# Patient Record
Sex: Female | Born: 1995 | Race: Asian | Hispanic: No | Marital: Married
Health system: Southern US, Community
[De-identification: ages and names within clinical notes are randomized; demographics above are authoritative.]

## PROBLEM LIST (undated history)

## (undated) DIAGNOSIS — R7303 Prediabetes: Secondary | ICD-10-CM

## (undated) DIAGNOSIS — E039 Hypothyroidism, unspecified: Secondary | ICD-10-CM

## (undated) DIAGNOSIS — D352 Benign neoplasm of pituitary gland: Secondary | ICD-10-CM

## (undated) DIAGNOSIS — Z905 Acquired absence of kidney: Secondary | ICD-10-CM

## (undated) HISTORY — DX: Prediabetes: R73.03

## (undated) HISTORY — DX: Acquired absence of kidney: Z90.5

## (undated) HISTORY — DX: Benign neoplasm of pituitary gland: D35.2

## (undated) HISTORY — PX: NEPHRECTOMY: SHX65

## (undated) HISTORY — DX: Hypothyroidism, unspecified: E03.9

---

## 2020-07-19 ENCOUNTER — Other Ambulatory Visit: Payer: Self-pay | Admitting: Internal Medicine

## 2020-07-19 DIAGNOSIS — D443 Neoplasm of uncertain behavior of pituitary gland: Secondary | ICD-10-CM

## 2020-07-19 DIAGNOSIS — D352 Benign neoplasm of pituitary gland: Secondary | ICD-10-CM

## 2020-07-23 ENCOUNTER — Ambulatory Visit
Admission: RE | Admit: 2020-07-23 | Discharge: 2020-07-23 | Disposition: A | Discharge status: Home / Self Care | Payer: No Typology Code available for payment source | Source: Ambulatory Visit | Attending: Internal Medicine | Admitting: Internal Medicine

## 2020-07-23 ENCOUNTER — Other Ambulatory Visit: Payer: Self-pay

## 2020-07-23 DIAGNOSIS — D352 Benign neoplasm of pituitary gland: Secondary | ICD-10-CM

## 2020-07-23 DIAGNOSIS — D443 Neoplasm of uncertain behavior of pituitary gland: Secondary | ICD-10-CM

## 2020-08-06 ENCOUNTER — Other Ambulatory Visit: Payer: Self-pay

## 2020-08-06 ENCOUNTER — Ambulatory Visit
Admission: RE | Admit: 2020-08-06 | Discharge: 2020-08-06 | Disposition: A | Discharge status: Home / Self Care | Payer: No Typology Code available for payment source | Source: Ambulatory Visit | Attending: Internal Medicine | Admitting: Internal Medicine

## 2020-08-06 DIAGNOSIS — D443 Neoplasm of uncertain behavior of pituitary gland: Secondary | ICD-10-CM | POA: Diagnosis not present

## 2020-08-06 MED ORDER — GADOBUTROL 1 MMOL/ML IV SOLN
6.0000 mL | Freq: Once | INTRAVENOUS | Status: AC | PRN
  Administered 2020-08-06: 6 mL via INTRAVENOUS

## 2020-08-11 ENCOUNTER — Encounter: Payer: No Typology Code available for payment source | Admitting: Obstetrics and Gynecology

## 2020-08-15 ENCOUNTER — Encounter: Payer: Self-pay | Admitting: Obstetrics and Gynecology

## 2020-08-15 ENCOUNTER — Telehealth: Payer: Self-pay | Admitting: Obstetrics and Gynecology

## 2020-08-15 NOTE — Telephone Encounter (Signed)
Patient No showed a referral appointment, letter sent.

## 2020-10-23 ENCOUNTER — Other Ambulatory Visit: Payer: Self-pay

## 2020-10-23 MED ORDER — CABERGOLINE 0.5 MG PO TABS
ORAL_TABLET | ORAL | 2 refills | Status: DC
  Filled 2020-10-23: qty 13, 90d supply, fill #0
  Filled 2021-01-29: qty 13, 90d supply, fill #1

## 2021-01-02 ENCOUNTER — Other Ambulatory Visit: Payer: Self-pay

## 2021-01-02 DIAGNOSIS — R7303 Prediabetes: Secondary | ICD-10-CM | POA: Insufficient documentation

## 2021-01-02 MED ORDER — CABERGOLINE 0.5 MG PO TABS
ORAL_TABLET | ORAL | 2 refills | Status: DC
  Filled 2021-01-02: qty 13, 42d supply, fill #0
  Filled 2021-05-14: qty 13, 90d supply, fill #0

## 2021-01-15 ENCOUNTER — Other Ambulatory Visit: Payer: Self-pay

## 2021-01-22 ENCOUNTER — Encounter: Payer: Self-pay | Admitting: Obstetrics & Gynecology

## 2021-01-22 ENCOUNTER — Ambulatory Visit (INDEPENDENT_AMBULATORY_CARE_PROVIDER_SITE_OTHER): Payer: No Typology Code available for payment source | Admitting: Obstetrics & Gynecology

## 2021-01-22 ENCOUNTER — Other Ambulatory Visit: Payer: Self-pay

## 2021-01-22 VITALS — BP 90/60 | Ht 63.0 in | Wt 118.0 lb

## 2021-01-22 DIAGNOSIS — D352 Benign neoplasm of pituitary gland: Secondary | ICD-10-CM | POA: Diagnosis not present

## 2021-01-22 NOTE — Progress Notes (Signed)
  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  Now 28-30 day cycles the last 3 mos.  She has mid ovulatory pain 2 of the last 3 mos.  No prior pregnancy or gyn concerns.  PMHx: She  has no past medical history on file. Also,  has no past surgical history on file., family history is not on file.,  reports that she has never smoked. She has never used smokeless tobacco. She reports that she does not drink alcohol and does not use drugs.  She has a current medication list which includes the following prescription(s): cabergoline. Also, has No Known Allergies.  Review of Systems  Constitutional:  Negative for chills, fever and malaise/fatigue.  HENT:  Negative for congestion, sinus pain and sore throat.   Eyes:  Negative for blurred vision and pain.  Respiratory:  Negative for cough and wheezing.   Cardiovascular:  Negative for chest pain and leg swelling.  Gastrointestinal:  Negative for abdominal pain, constipation, diarrhea, heartburn, nausea and vomiting.  Genitourinary:  Negative for dysuria, frequency, hematuria and urgency.  Musculoskeletal:  Negative for back pain, joint pain, myalgias and neck pain.  Skin:  Negative for itching and rash.  Neurological:  Negative for dizziness, tremors and weakness.  Endo/Heme/Allergies:  Does not bruise/bleed easily.  Psychiatric/Behavioral:  Negative for depression. The patient is not nervous/anxious and does not have insomnia.    Objective: BP 90/60   Ht '5\' 3"'$  (1.6 m)   Wt 118 lb (53.5 kg)   LMP 01/05/2021   BMI 20.90 kg/m  Physical Exam Constitutional:      General: She is not in acute distress.    Appearance: She is well-developed.  Musculoskeletal:        General: Normal range of motion.  Neurological:     Mental Status: She is alert and oriented to person, place, and time.  Skin:     General: Skin is warm and dry.  Vitals reviewed.   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 PNV advised, also healthy practices prior to and during early pregnancy Plan PAP as she has never had one, prefers female provider Consider Clomid if in need of ovulatory assistence, but give time now that she is normalizing on meds  Barnett Applebaum, MD, [place], [facility] 01/22/2021  9:10 AM

## 2021-01-29 ENCOUNTER — Other Ambulatory Visit: Payer: Self-pay

## 2021-01-30 ENCOUNTER — Other Ambulatory Visit: Payer: Self-pay

## 2021-05-02 ENCOUNTER — Inpatient Hospital Stay: Admit: 2021-05-02 | Payer: Self-pay

## 2021-05-02 ENCOUNTER — Other Ambulatory Visit
Admission: RE | Admit: 2021-05-02 | Discharge: 2021-05-02 | Disposition: A | Discharge status: Home / Self Care | Payer: No Typology Code available for payment source | Attending: "Endocrinology | Admitting: "Endocrinology

## 2021-05-02 DIAGNOSIS — E221 Hyperprolactinemia: Secondary | ICD-10-CM | POA: Diagnosis not present

## 2021-05-02 DIAGNOSIS — D497 Neoplasm of unspecified behavior of endocrine glands and other parts of nervous system: Secondary | ICD-10-CM | POA: Insufficient documentation

## 2021-05-02 LAB — CORTISOL: Cortisol, Plasma: 2.6 ug/dL

## 2021-05-04 LAB — PROLACTIN: Prolactin: 10.7 ng/mL (ref 4.8–23.3)

## 2021-05-04 LAB — ACTH: C206 ACTH: 6.3 pg/mL — ABNORMAL LOW (ref 7.2–63.3)

## 2021-05-15 ENCOUNTER — Other Ambulatory Visit: Payer: Self-pay

## 2021-08-15 ENCOUNTER — Other Ambulatory Visit: Payer: Self-pay

## 2021-08-15 MED ORDER — CABERGOLINE 0.5 MG PO TABS
ORAL_TABLET | ORAL | 2 refills | Status: DC
  Filled 2021-08-15: qty 13, 90d supply, fill #0
  Filled 2021-08-27: qty 4, 28d supply, fill #0

## 2021-08-23 ENCOUNTER — Other Ambulatory Visit: Payer: Self-pay

## 2021-08-27 ENCOUNTER — Other Ambulatory Visit: Payer: Self-pay

## 2021-09-13 ENCOUNTER — Other Ambulatory Visit: Payer: Self-pay

## 2021-09-13 MED ORDER — CABERGOLINE 0.5 MG PO TABS
ORAL_TABLET | ORAL | 2 refills | Status: DC
  Filled 2021-09-13 – 2021-10-03 (×2): qty 4, 28d supply, fill #0

## 2021-09-17 ENCOUNTER — Other Ambulatory Visit: Payer: Self-pay

## 2021-10-02 ENCOUNTER — Other Ambulatory Visit: Payer: Self-pay

## 2021-10-03 ENCOUNTER — Other Ambulatory Visit: Payer: Self-pay

## 2021-10-26 ENCOUNTER — Other Ambulatory Visit: Payer: Self-pay

## 2021-10-26 MED ORDER — CABERGOLINE 0.5 MG PO TABS
ORAL_TABLET | ORAL | 2 refills | Status: DC
  Filled 2021-10-26: qty 13, 30d supply, fill #0
  Filled 2021-11-07: qty 4, 28d supply, fill #0

## 2021-10-26 MED ORDER — IBUPROFEN 800 MG PO TABS
ORAL_TABLET | ORAL | 0 refills | Status: DC
  Filled 2021-10-26: qty 20, 7d supply, fill #0

## 2021-11-06 ENCOUNTER — Other Ambulatory Visit: Payer: Self-pay

## 2021-11-07 ENCOUNTER — Other Ambulatory Visit: Payer: Self-pay

## 2021-12-04 ENCOUNTER — Other Ambulatory Visit: Payer: Self-pay

## 2021-12-04 MED ORDER — CABERGOLINE 0.5 MG PO TABS
ORAL_TABLET | ORAL | 2 refills | Status: DC
  Filled 2021-12-04: qty 4, 28d supply, fill #0

## 2022-01-07 ENCOUNTER — Other Ambulatory Visit: Payer: Self-pay

## 2022-01-07 MED ORDER — CABERGOLINE 0.5 MG PO TABS
ORAL_TABLET | ORAL | 2 refills | Status: DC
  Filled 2022-01-07: qty 5, 30d supply, fill #0

## 2022-01-19 IMAGING — MR MR HEAD WO/W CM
12 of 19 series · 26 of 48 positions shown · IV contrast (6ml Gadavist)
Comparison: None.

CLINICAL DATA: Elevated prolactin of 126. Irregular menstrual
cycle.

EXAM:
MRI HEAD WITHOUT AND WITH CONTRAST
TECHNIQUE: Multiplanar, multiecho pulse sequences of the brain and surrounding
structures were obtained without and with intravenous contrast.
CONTRAST:  6mL GADAVIST GADOBUTROL 1 MMOL/ML IV SOLN

[Series 5: T1 · sagittal · 5.0mm · 0.62mm/px · 2 of 21 slices shown]
[im 1/21]
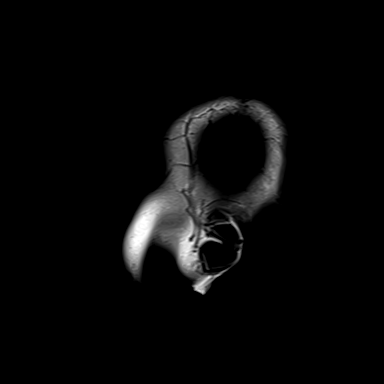
[im 21/21]
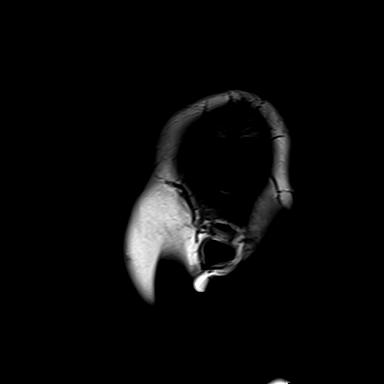

[Series 8: T2 · axial · 5.0mm · 0.53mm/px · z∈[-107,+37]mm · 2 of 25 slices shown]
[im 1/25]
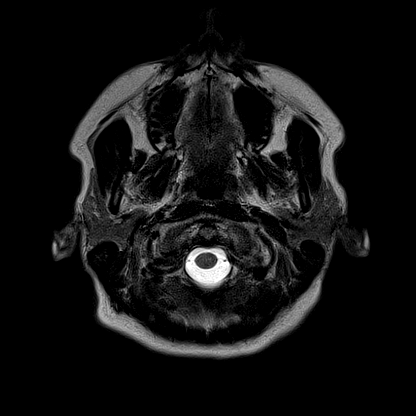
[im 25/25]
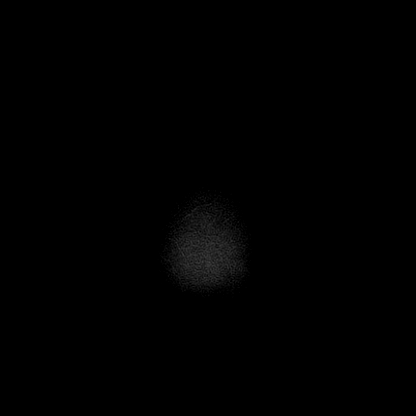

[Series 10: FLAIR · axial · 3.0mm · 0.53mm/px · z∈[-110,+40]mm · 4 of 51 slices shown]
[im 1/51]
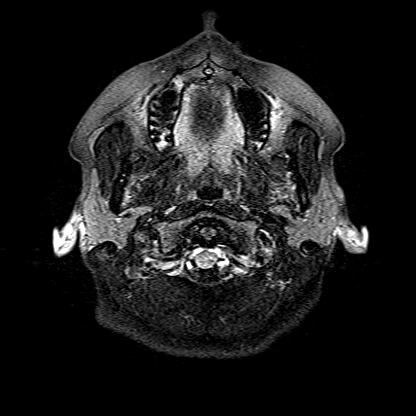
[im 17/51]
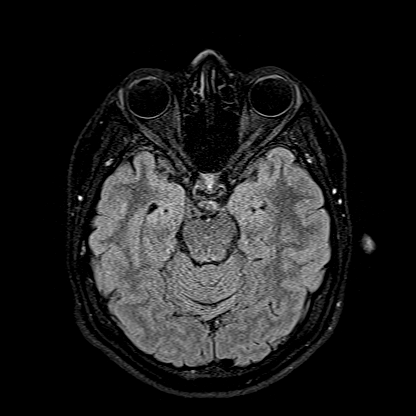
[im 34/51]
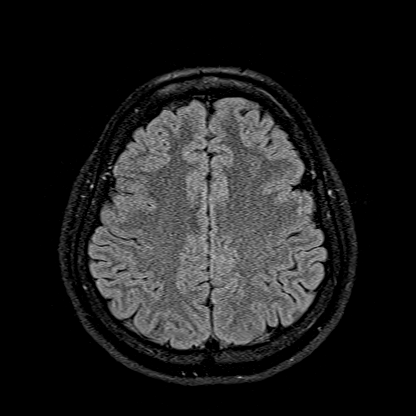
[im 51/51]
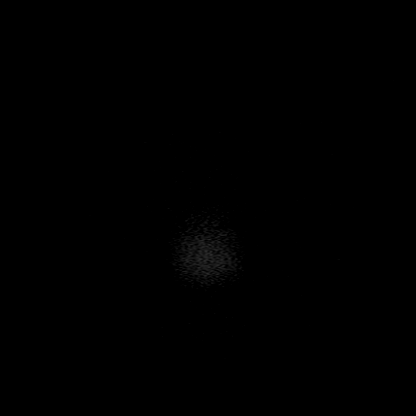

[Series 14: T1 post-contrast · coronal · 3.0mm · 0.28mm/px · 1 of 11 slices shown (1 of 9)]
[im 1/11]
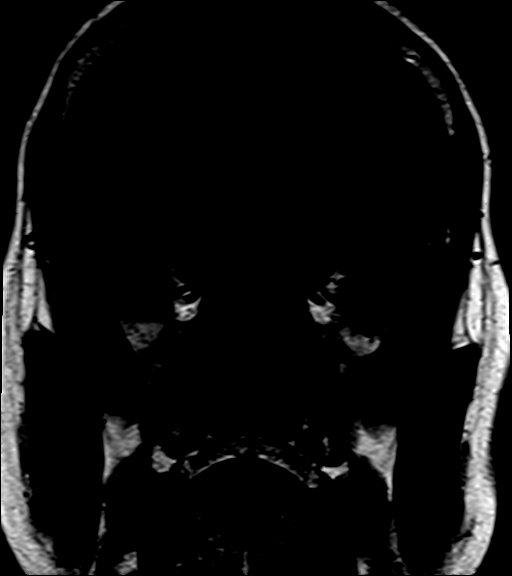

[Series 15: T1 post-contrast · coronal · 3.0mm · 0.28mm/px · 1 of 11 slices shown (2 of 9)]
[im 1/11]
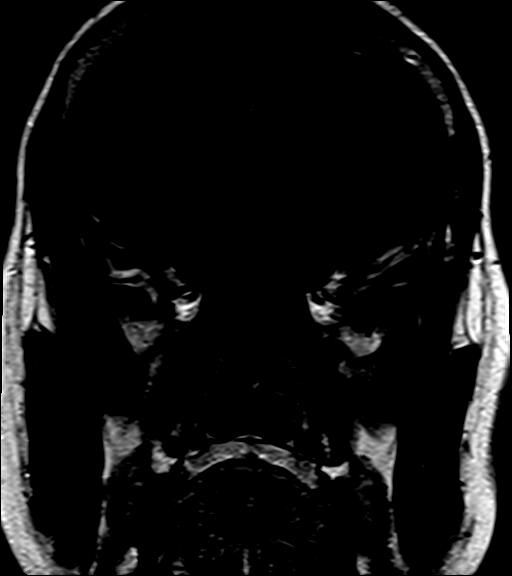

[Series 16: T1 post-contrast · coronal · 3.0mm · 0.28mm/px · 1 of 11 slices shown (3 of 9)]
[im 1/11]
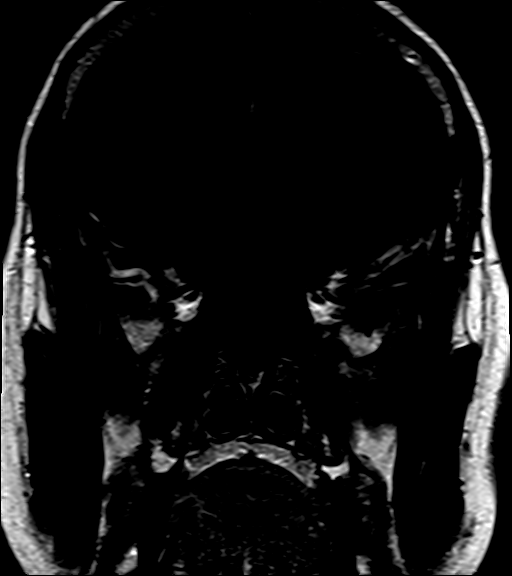

[Series 17: T1 post-contrast · coronal · 3.0mm · 0.28mm/px · 1 of 11 slices shown (4 of 9)]
[im 1/11]
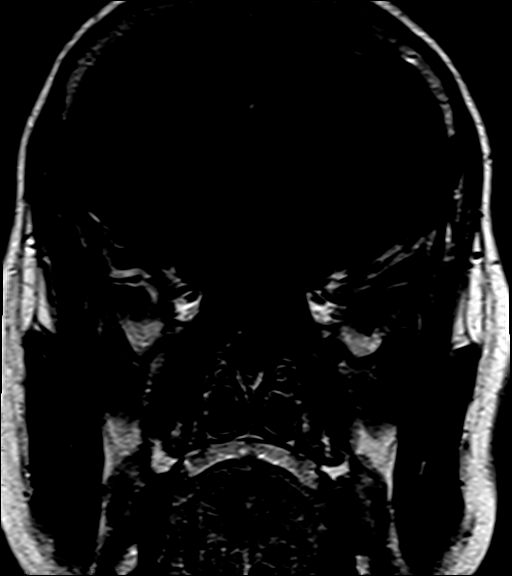

[Series 18: T1 post-contrast · coronal · 3.0mm · 0.28mm/px · 1 of 11 slices shown (5 of 9)]
[im 1/11]
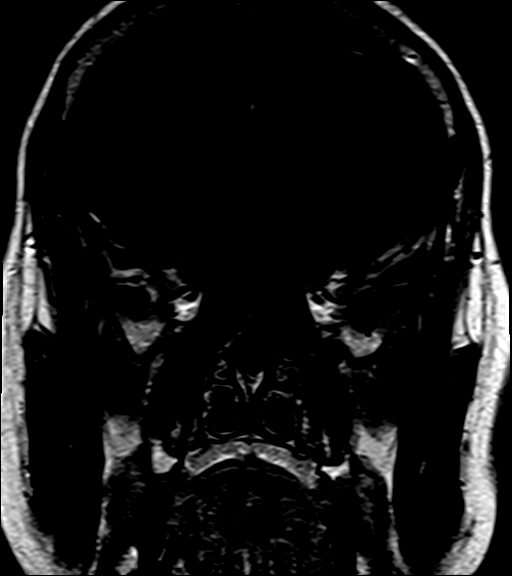

[Series 19: T1 post-contrast · coronal · 3.0mm · 0.21mm/px · 1 of 13 slices shown (6 of 9)]
[im 1/13]
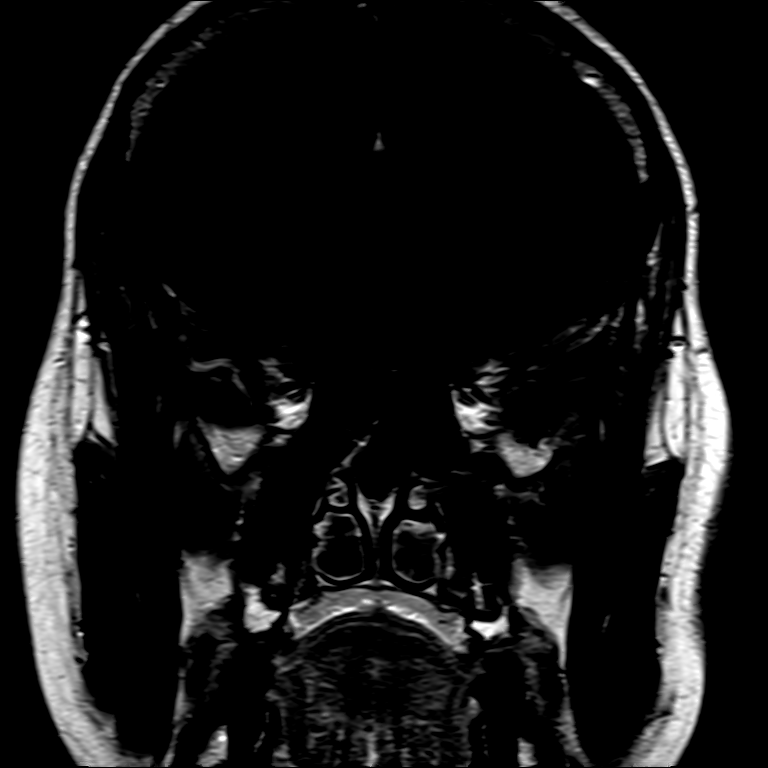

[Series 20: T1 post-contrast · sagittal · 3.0mm · 0.21mm/px · 1 of 13 slices shown (7 of 9)]
[im 1/13]
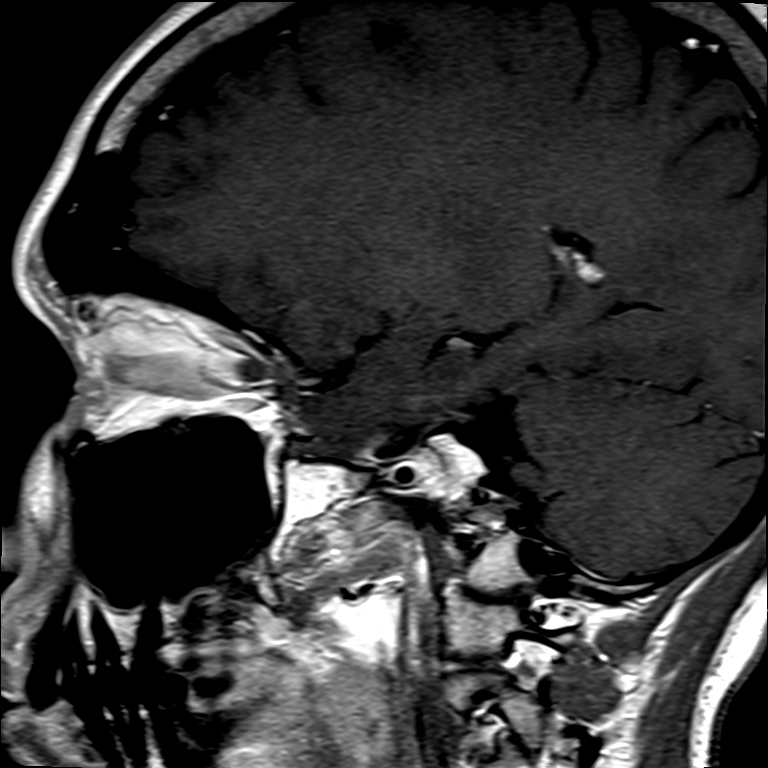

[Series 21: T1 post-contrast · axial · 1.0mm · 0.98mm/px · z∈[-111,+53]mm · 8 of 176 slices shown (8 of 9)]
[im 12/176]
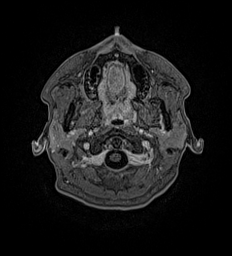
[im 36/176]
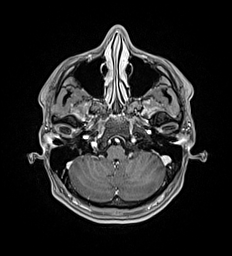
[im 59/176]
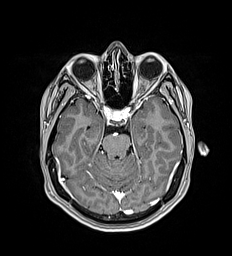
[im 82/176]
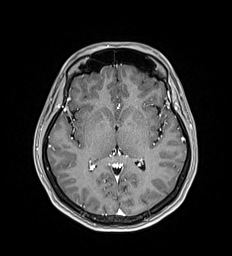
[im 106/176]
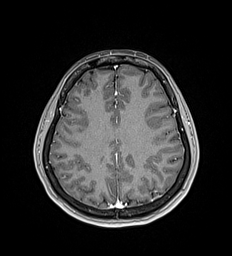
[im 129/176]
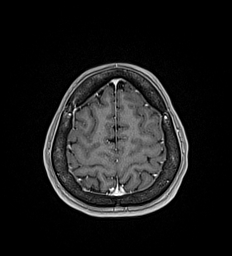
[im 152/176]
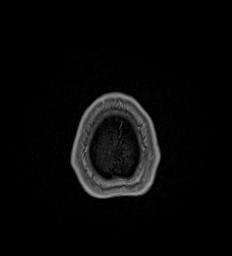
[im 176/176]
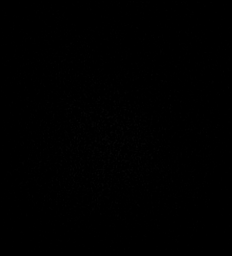

[Series 22: T1 post-contrast · coronal · 5.0mm · 0.57mm/px · 3 of 29 slices shown (9 of 9)]
[im 1/29]
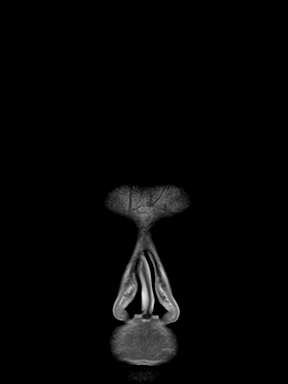
[im 15/29]
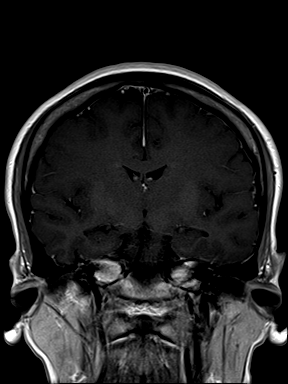
[im 29/29]
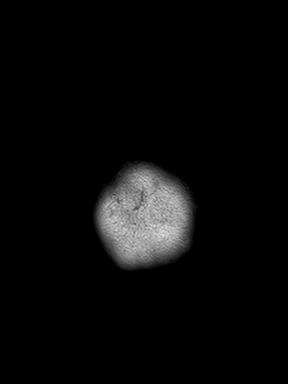

[26 of 48 positions shown; findings below may reference images not displayed]

FINDINGS: Brain: No acute infarction, hemorrhage, hydrocephalus, extra-axial
collection or mass lesion outside the pituitary. No abnormal
enhancement outside the pituitary.

Dedicated pituitary protocol was performed with high-resolution
dynamic contrast through the pituitary. This demonstrates an
approximately 7 mm area of hypoenhancement within the right aspect
of the pituitary (for example series 16, image 6) which decreases in
conspicuity on delayed phases. There is slight elevation of the
right aspect of the pituitary gland with slight leftward
infundibular deviation. The infundibulum is otherwise unremarkable
in size/appearance.

Vascular: Major arterial flow voids are maintained at the skull
base.

Skull and upper cervical spine: Normal marrow signal.

Sinuses/Orbits: Clear sinuses.  Unremarkable orbits.

Other: No mastoid effusions.
IMPRESSION: 1. Approximately 7 mm rounded focus of hypoenhancement within the
right aspect of the pituitary gland, concerning for a microadenoma
given the provided clinical history.
2. Otherwise, no acute intracranial abnormality.

## 2022-05-02 ENCOUNTER — Other Ambulatory Visit
Admission: RE | Admit: 2022-05-02 | Discharge: 2022-05-02 | Disposition: A | Discharge status: Home / Self Care | Payer: Self-pay | Attending: Internal Medicine | Admitting: Internal Medicine

## 2022-05-02 DIAGNOSIS — Z524 Kidney donor: Secondary | ICD-10-CM | POA: Insufficient documentation

## 2022-05-02 LAB — CBC WITH DIFFERENTIAL/PLATELET
Abs Immature Granulocytes: 0.02 10*3/uL (ref 0.00–0.07)
Basophils Absolute: 0.1 10*3/uL (ref 0.0–0.1)
Basophils Relative: 1 %
Eosinophils Absolute: 0.1 10*3/uL (ref 0.0–0.5)
Eosinophils Relative: 1 %
HCT: 41.9 % (ref 36.0–46.0)
Hemoglobin: 14.3 g/dL (ref 12.0–15.0)
Immature Granulocytes: 0 %
Lymphocytes Relative: 30 %
Lymphs Abs: 2.3 10*3/uL (ref 0.7–4.0)
MCH: 27.1 pg (ref 26.0–34.0)
MCHC: 34.1 g/dL (ref 30.0–36.0)
MCV: 79.4 fL — ABNORMAL LOW (ref 80.0–100.0)
Monocytes Absolute: 0.6 10*3/uL (ref 0.1–1.0)
Monocytes Relative: 8 %
Neutro Abs: 4.6 10*3/uL (ref 1.7–7.7)
Neutrophils Relative %: 60 %
Platelets: 327 10*3/uL (ref 150–400)
RBC: 5.28 MIL/uL — ABNORMAL HIGH (ref 3.87–5.11)
RDW: 12.5 % (ref 11.5–15.5)
WBC: 7.8 10*3/uL (ref 4.0–10.5)
nRBC: 0 % (ref 0.0–0.2)

## 2022-05-02 LAB — URINALYSIS, COMPLETE (UACMP) WITH MICROSCOPIC
Bacteria, UA: NONE SEEN
Bilirubin Urine: NEGATIVE
Glucose, UA: NEGATIVE mg/dL
Hgb urine dipstick: NEGATIVE
Ketones, ur: NEGATIVE mg/dL
Leukocytes,Ua: NEGATIVE
Nitrite: NEGATIVE
Protein, ur: NEGATIVE mg/dL
Specific Gravity, Urine: 1.002 — ABNORMAL LOW (ref 1.005–1.030)
pH: 7 (ref 5.0–8.0)

## 2022-05-02 LAB — COMPREHENSIVE METABOLIC PANEL
ALT: 10 U/L (ref 0–44)
AST: 17 U/L (ref 15–41)
Albumin: 4 g/dL (ref 3.5–5.0)
Alkaline Phosphatase: 57 U/L (ref 38–126)
Anion gap: 7 (ref 5–15)
BUN: 13 mg/dL (ref 6–20)
CO2: 23 mmol/L (ref 22–32)
Calcium: 8.8 mg/dL — ABNORMAL LOW (ref 8.9–10.3)
Chloride: 104 mmol/L (ref 98–111)
Creatinine, Ser: 0.56 mg/dL (ref 0.44–1.00)
GFR, Estimated: >60 mL/min (ref >60)
Glucose, Bld: 96 mg/dL (ref 70–99)
Potassium: 4 mmol/L (ref 3.5–5.1)
Sodium: 134 mmol/L — ABNORMAL LOW (ref 135–145)
Total Bilirubin: 0.9 mg/dL (ref 0.3–1.2)
Total Protein: 7.2 g/dL (ref 6.5–8.1)

## 2022-05-02 LAB — HEMOGLOBIN A1C
Hgb A1c MFr Bld: 5.5 % (ref 4.8–5.6)
Mean Plasma Glucose: 111.15 mg/dL

## 2022-05-02 LAB — ABO/RH: ABO/RH(D): O POS

## 2022-05-03 LAB — MISC LABCORP TEST (SEND OUT)
Labcorp test code: 121022
Labcorp test code: 6002

## 2022-08-17 DIAGNOSIS — Z905 Acquired absence of kidney: Secondary | ICD-10-CM | POA: Insufficient documentation

## 2022-08-22 ENCOUNTER — Other Ambulatory Visit: Payer: Self-pay

## 2022-08-22 MED ORDER — AMOXICILLIN-POT CLAVULANATE 875-125 MG PO TABS
1.0000 | ORAL_TABLET | Freq: Two times a day (BID) | ORAL | 0 refills | Status: DC
  Filled 2022-08-22: qty 14, 7d supply, fill #0

## 2023-01-14 DIAGNOSIS — D497 Neoplasm of unspecified behavior of endocrine glands and other parts of nervous system: Secondary | ICD-10-CM | POA: Diagnosis not present

## 2023-01-14 DIAGNOSIS — Z524 Kidney donor: Secondary | ICD-10-CM | POA: Diagnosis not present

## 2023-01-14 DIAGNOSIS — E221 Hyperprolactinemia: Secondary | ICD-10-CM | POA: Diagnosis not present

## 2023-01-15 ENCOUNTER — Other Ambulatory Visit: Payer: Self-pay

## 2023-01-15 MED ORDER — CABERGOLINE 0.5 MG PO TABS
0.5000 mg | ORAL_TABLET | ORAL | 3 refills | Status: DC
  Filled 2023-01-15: qty 8, 28d supply, fill #0
  Filled 2023-04-14: qty 8, 28d supply, fill #1

## 2023-01-16 ENCOUNTER — Other Ambulatory Visit: Payer: Self-pay

## 2023-01-20 ENCOUNTER — Other Ambulatory Visit: Payer: Self-pay | Admitting: "Endocrinology

## 2023-01-20 DIAGNOSIS — D497 Neoplasm of unspecified behavior of endocrine glands and other parts of nervous system: Secondary | ICD-10-CM

## 2023-01-29 ENCOUNTER — Ambulatory Visit
Admission: RE | Admit: 2023-01-29 | Discharge: 2023-01-29 | Disposition: A | Discharge status: Home / Self Care | Payer: 59 | Source: Ambulatory Visit | Attending: "Endocrinology | Admitting: "Endocrinology

## 2023-01-29 DIAGNOSIS — D352 Benign neoplasm of pituitary gland: Secondary | ICD-10-CM | POA: Diagnosis not present

## 2023-01-29 DIAGNOSIS — E236 Other disorders of pituitary gland: Secondary | ICD-10-CM | POA: Diagnosis not present

## 2023-01-29 DIAGNOSIS — D497 Neoplasm of unspecified behavior of endocrine glands and other parts of nervous system: Secondary | ICD-10-CM | POA: Diagnosis not present

## 2023-01-29 MED ORDER — GADOBUTROL 1 MMOL/ML IV SOLN
5.0000 mL | Freq: Once | INTRAVENOUS | Status: AC | PRN
  Administered 2023-01-29: 5 mL via INTRAVENOUS

## 2023-02-03 ENCOUNTER — Other Ambulatory Visit: Payer: Self-pay | Admitting: Oncology

## 2023-02-03 DIAGNOSIS — Z006 Encounter for examination for normal comparison and control in clinical research program: Secondary | ICD-10-CM

## 2023-02-11 ENCOUNTER — Other Ambulatory Visit
Admission: RE | Admit: 2023-02-11 | Discharge: 2023-02-11 | Disposition: A | Discharge status: Home / Self Care | Payer: 59 | Attending: Oncology | Admitting: Oncology

## 2023-02-11 DIAGNOSIS — Z006 Encounter for examination for normal comparison and control in clinical research program: Secondary | ICD-10-CM | POA: Insufficient documentation

## 2023-03-04 DIAGNOSIS — Z0000 Encounter for general adult medical examination without abnormal findings: Secondary | ICD-10-CM | POA: Diagnosis not present

## 2023-03-04 DIAGNOSIS — Z3169 Encounter for other general counseling and advice on procreation: Secondary | ICD-10-CM | POA: Diagnosis not present

## 2023-03-04 DIAGNOSIS — D352 Benign neoplasm of pituitary gland: Secondary | ICD-10-CM | POA: Diagnosis not present

## 2023-03-04 DIAGNOSIS — Z905 Acquired absence of kidney: Secondary | ICD-10-CM | POA: Diagnosis not present

## 2023-03-04 DIAGNOSIS — R7303 Prediabetes: Secondary | ICD-10-CM | POA: Diagnosis not present

## 2023-04-14 ENCOUNTER — Other Ambulatory Visit: Payer: Self-pay

## 2023-04-14 DIAGNOSIS — Z905 Acquired absence of kidney: Secondary | ICD-10-CM | POA: Diagnosis not present

## 2023-04-14 DIAGNOSIS — Z7185 Encounter for immunization safety counseling: Secondary | ICD-10-CM | POA: Diagnosis not present

## 2023-04-14 DIAGNOSIS — N979 Female infertility, unspecified: Secondary | ICD-10-CM | POA: Diagnosis not present

## 2023-04-14 DIAGNOSIS — D352 Benign neoplasm of pituitary gland: Secondary | ICD-10-CM | POA: Diagnosis not present

## 2023-04-14 DIAGNOSIS — Z23 Encounter for immunization: Secondary | ICD-10-CM | POA: Diagnosis not present

## 2023-04-14 DIAGNOSIS — Z3169 Encounter for other general counseling and advice on procreation: Secondary | ICD-10-CM | POA: Diagnosis not present

## 2023-04-14 MED ORDER — DOXYCYCLINE HYCLATE 100 MG PO TABS
100.0000 mg | ORAL_TABLET | Freq: Two times a day (BID) | ORAL | 0 refills | Status: DC
  Filled 2023-04-14: qty 10, 5d supply, fill #0

## 2023-04-17 DIAGNOSIS — E039 Hypothyroidism, unspecified: Secondary | ICD-10-CM | POA: Insufficient documentation

## 2023-04-17 DIAGNOSIS — R7989 Other specified abnormal findings of blood chemistry: Secondary | ICD-10-CM | POA: Diagnosis not present

## 2023-04-17 DIAGNOSIS — Z3169 Encounter for other general counseling and advice on procreation: Secondary | ICD-10-CM | POA: Diagnosis not present

## 2023-04-28 ENCOUNTER — Other Ambulatory Visit: Payer: Self-pay

## 2023-04-28 DIAGNOSIS — R946 Abnormal results of thyroid function studies: Secondary | ICD-10-CM | POA: Diagnosis not present

## 2023-04-28 DIAGNOSIS — Z3169 Encounter for other general counseling and advice on procreation: Secondary | ICD-10-CM | POA: Diagnosis not present

## 2023-04-28 DIAGNOSIS — R7989 Other specified abnormal findings of blood chemistry: Secondary | ICD-10-CM | POA: Diagnosis not present

## 2023-04-28 MED ORDER — LEVOTHYROXINE SODIUM 50 MCG PO TABS
50.0000 ug | ORAL_TABLET | Freq: Every day | ORAL | 3 refills | Status: DC
  Filled 2023-04-28: qty 90, 90d supply, fill #0

## 2023-04-29 ENCOUNTER — Other Ambulatory Visit: Payer: Self-pay

## 2023-04-29 MED ORDER — CABERGOLINE 0.5 MG PO TABS
0.2500 mg | ORAL_TABLET | ORAL | 3 refills | Status: DC
  Filled 2023-04-29: qty 16, 75d supply, fill #0

## 2023-05-21 DIAGNOSIS — Z124 Encounter for screening for malignant neoplasm of cervix: Secondary | ICD-10-CM | POA: Diagnosis not present

## 2023-05-21 DIAGNOSIS — E039 Hypothyroidism, unspecified: Secondary | ICD-10-CM | POA: Diagnosis not present

## 2023-05-21 DIAGNOSIS — Z23 Encounter for immunization: Secondary | ICD-10-CM | POA: Diagnosis not present

## 2023-05-21 DIAGNOSIS — R7303 Prediabetes: Secondary | ICD-10-CM | POA: Diagnosis not present

## 2023-05-21 DIAGNOSIS — E221 Hyperprolactinemia: Secondary | ICD-10-CM | POA: Insufficient documentation

## 2023-05-21 DIAGNOSIS — D352 Benign neoplasm of pituitary gland: Secondary | ICD-10-CM | POA: Diagnosis not present

## 2023-05-21 DIAGNOSIS — N979 Female infertility, unspecified: Secondary | ICD-10-CM | POA: Diagnosis not present

## 2023-06-16 ENCOUNTER — Other Ambulatory Visit: Payer: Self-pay

## 2023-06-16 MED ORDER — CABERGOLINE 0.5 MG PO TABS
0.2500 mg | ORAL_TABLET | ORAL | 3 refills | Status: DC
  Filled 2023-06-16: qty 16, 74d supply, fill #0
  Filled 2023-10-16: qty 16, 74d supply, fill #1

## 2023-06-19 ENCOUNTER — Other Ambulatory Visit: Payer: Self-pay

## 2023-07-01 DIAGNOSIS — Z20828 Contact with and (suspected) exposure to other viral communicable diseases: Secondary | ICD-10-CM | POA: Diagnosis not present

## 2023-07-03 ENCOUNTER — Other Ambulatory Visit: Payer: Self-pay

## 2023-07-17 DIAGNOSIS — Z0000 Encounter for general adult medical examination without abnormal findings: Secondary | ICD-10-CM | POA: Diagnosis not present

## 2023-07-17 DIAGNOSIS — D352 Benign neoplasm of pituitary gland: Secondary | ICD-10-CM | POA: Diagnosis not present

## 2023-07-23 ENCOUNTER — Other Ambulatory Visit: Payer: Self-pay

## 2023-07-23 DIAGNOSIS — E039 Hypothyroidism, unspecified: Secondary | ICD-10-CM | POA: Diagnosis not present

## 2023-07-23 DIAGNOSIS — E221 Hyperprolactinemia: Secondary | ICD-10-CM | POA: Diagnosis not present

## 2023-07-23 MED ORDER — LEVOTHYROXINE SODIUM 75 MCG PO TABS
75.0000 ug | ORAL_TABLET | Freq: Every day | ORAL | 3 refills | Status: DC
  Filled 2023-07-23: qty 90, 90d supply, fill #0
  Filled 2023-10-16: qty 90, 90d supply, fill #1

## 2023-07-24 ENCOUNTER — Ambulatory Visit: Payer: 59 | Admitting: Dietician

## 2024-01-28 DIAGNOSIS — E039 Hypothyroidism, unspecified: Secondary | ICD-10-CM | POA: Diagnosis not present

## 2024-01-28 DIAGNOSIS — E221 Hyperprolactinemia: Secondary | ICD-10-CM | POA: Diagnosis not present

## 2024-01-28 NOTE — Progress Notes (Addendum)
 History of present illness Nicole Taylor is seen today for f/u of pituitary.  She is a 28 y.o. female who began having irregular menstrual cycles in 12/21.  She was also having galactorrhea, so a prolactin was checked in 1/22 that was significantly elevated.  An MRI of the pituitary was ordered which showed a pituitary tumor.  In 4/22, I screened her for other pituitary hormone abnormalities and started her on cabergoline .  I last saw her in 1/25.  At that time, I increased her LT4.  She took USMLE step 1 in 3/25. She is studying for step 2.   She is currently on cabergoline  0.25 tiw.  She has no headaches.   She has no changes in vision.  She has no galactorrhea.  Her cycles are normal.  She is concerned about her pituitary.   She is currently on 75 mcg LT4 daily.  She has no anterior neck pain/swelling.  She says she has a family hx of thyroid  disease.  She had some weight gain.  She feels fine overall.  She is concerned about her thyroid .    ROS:  No chest pain.  No SOB.   Medical History: Past Medical History:  Diagnosis Date   Hyperprolactinemia (CMS/HHS-HCC)    Hypothyroidism    Prediabetes    Prolactinoma (CMS/HHS-HCC)    Solitary kidney, acquired     Surgical History: Past Surgical History:  Procedure Laterality Date   LAPAROSCOPIC NEPHRECTOMY LIVING DONOR Left 08/16/2022   Procedure: LAPAROSCOPY, SURGICAL; DONOR NEPHRECTOMY (INCLUDING COLD PRESERVATION), FROM LIVING DONOR;  Surgeon: Maxwell Drape, MD;  Location: [facility];  Service: General Surgery;  Laterality: Left;    Social History:  reports that she has never smoked. She has never used smokeless tobacco. She reports that she does not currently use alcohol. She reports that she does not use drugs.  Married.  She moved from [place] in 2021.  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  She used to work at [employer] but is trying to focus on getting into residency.  Family  History: family history includes Diabetes in her father; High blood pressure (Hypertension) in her father.  Medications: Current Outpatient Medications  Medication Sig Dispense Refill   cabergoline  (DOSTINEX ) 0.5 mg tablet Take 0.5 tablets (0.25 mg total) by mouth 3 (three) times a week 20 tablet 3   levothyroxine  (SYNTHROID ) 75 MCG tablet Take 1 tablet (75 mcg total) by mouth once daily Take on an empty stomach with a glass of water at least 30-60 minutes before breakfast. 90 tablet 3   No current facility-administered medications for this visit.    Allergies: No Known Allergies  Physical Exam: Vitals:   01/28/24 1029  BP: 100/70  Pulse: 88  SpO2: 98%  Weight: 62.6 kg (138 lb)  Height: 160 cm (5' 3)     Body mass index is 24.45 kg/m. HEENT:  Visual fields grossly intact to confrontation. GENERAL: Pleasant, well-appearing female in no distress.   Physical exam otherwise deferred due to coronavirus precautions.   Labs: 07/18/2020: A1c = 5.8.  K/Cr/Ca = 4.2/0.4/9.2.  Chol. = 160/47/56.4/94.  LFTs nl.  TSH = 3.854.  Prolactin = 126.  08/06/2020: MRI of pituitary w/ 7 mm area of hypo-enhancement in right pituitary.  Slight elevation of the right pituitary w/ leftward displacement of the infundibulum.  10/18/2020:  Prolactin=132.  TSH=3.91, FT4=1.34.  ACTH =12.1, Cortisol (9:45am)=2.9.  IGF-1=180.  01/24/2021:  PRL=19.4.  05/02/2021:  PRL =  10.7.  Cortisol (5:30PM) = 2.6.  ACTH  = 6.3.   10/25/2021:  K/Cr/Ca= 4.2/0.64/9.2.  ACTH =9.7.  Cortisol (9:59am)=8.7 (6.2-19.4).  Prolactin=8.8.  05/02/2022:  A1c = 5.5  08/22/2022:  K/Cr/Ca = 4.2/0.9/9.4.  LFTs nl.  01/14/2023:  K/Cr/Ca= 4.6/0.9/9.3.  Prolactin=136  01/29/2023: MRI of the pituitary with subtle hypoenhancing lesion in the right aspect of the pituitary measuring 5 mm (6-7 mm in 2/22).  Infundibulum subtly deviated leftward.  Otherwise unremarkable.  04/17/2023:  UDY=82.766.  FT4=0.67. 04/28/2023:  TSH=9.776.  FT4=0.85.  TPO>600.   Prolactin=44.8.   04/28/2023:  TSH=9.776.  FT4=0.85.  TPO>600.  Prolactin=44.8.    07/17/2023:  K/Cr/Ca = 4.3/0.8/9.3.  LFTs nl.  Prolactin = 15.8.  TSH = 4.225.  Assessment/Plan: 1.  Pituitary tumor.  MRI from 2/22 showed a small right-sided pituitary adenoma.  It is likely prolactin secreting.  Pituitary hormones were otherwise nl.  Repeat MRI in 7/24 was unchanged (5 mm tumor vs 6-7 in 2/22), though Infundibulum subtly deviated leftward.  I will plan repeat MRI in a few years.  2.  Elevated prolactin.  Prolactin level was found to be significantly elevated in 1/22, likely due to her prolactinoma.  In 4/22, I started her on cabergoline . Her prolactin improved significantly in 7/22 and her galactorrhea and her menstrual irregularities have resolved.  Prolactin was elevated in 7/24 off cabergoline  0.25 mg biw so I restarted her on it at that time.  Her Prolactin was still minimally elevated at she was taking her cabergoline  intermittently.  She now takes it tiw.  It was nl in 1/25. I advised her to take it (M,W, and F). I will keep her dose the same. I will recheck today.   3.  Low Cortisol.  Her cortisol in 4/22 was a little bit low but it was not done before 9am.  Repeat Cortisol and ACTH  were nl in 4/23.  She does not likely have a cortisol problem.  4.  Preconception planning.  They are currently trying to get pregnant.  We previously discussed that her elevated prolactin would make fertility more difficult.  Being on cabergoline  will improve fertility.  Consider formal visual field testing and the second/third trimester for her.   I told her to let me know if she becomes pregnant as I'll recheck her thyroid  and have her stop cabergoline .  5.  Kidney Donation.  She donated a kidney to her husband in 2/24.  She says his txp is doing well.  6.  Hypothyroidism.  Her TSH was elevated in 10/24 at 17.233.  It was again elevated on recheck later that month so I started her on LT4 daily. She is currently  taking 75 mcg of LT4 daily.  I will recheck TSH today and adjust dose as necessary.   7.  RTC in 6 months.    Addendum:  01/28/2024 :  TSH=6.205.  Prolactin=10.9. Refills sent (both T4 and cabergoline ).  Results sent via MyChart.  Will increase LT4 to 88 mcg daily.  Ordered TSH for 3 months  03/02/24:  Pt messaged.  Said pregnant now.  Saw GYN today and TSH ordered (not done yet).  Told to stop cabergoline  and I'd let her know when I see the TSH result.  Also told to get TSH I ordered for mid October. 03/02/24:  TSH=8.436.  Will increase dose to 112 mcg daily  05/18/24:  Pt messaged with TSH.  05/12/24:  TSH=4.56.  FT4=1.37.  Will increase LT4 to 125.  06/10/24:  Pt messaged with  labs:  06/09/24:  TSH=0.991.  Told to stay on same dose.  This note is partially prepared by Earla Daria Messier, Scribe, in the presence of and acting as the scribe of Dr. Debby Breaker , MD.     [facility], MD

## 2024-01-30 ENCOUNTER — Other Ambulatory Visit: Payer: Self-pay

## 2024-01-30 MED ORDER — LEVOTHYROXINE SODIUM 88 MCG PO TABS
88.0000 ug | ORAL_TABLET | Freq: Every day | ORAL | 4 refills | Status: DC
  Filled 2024-01-30 – 2024-02-09 (×2): qty 90, 90d supply, fill #0

## 2024-01-30 MED ORDER — CABERGOLINE 0.5 MG PO TABS
0.2500 mg | ORAL_TABLET | ORAL | 3 refills | Status: DC
  Filled 2024-01-30: qty 16, 72d supply, fill #0
  Filled 2024-02-09: qty 16, 74d supply, fill #0

## 2024-02-09 ENCOUNTER — Other Ambulatory Visit: Payer: Self-pay

## 2024-03-02 DIAGNOSIS — O219 Vomiting of pregnancy, unspecified: Secondary | ICD-10-CM | POA: Diagnosis not present

## 2024-03-02 DIAGNOSIS — D352 Benign neoplasm of pituitary gland: Secondary | ICD-10-CM | POA: Diagnosis not present

## 2024-03-02 DIAGNOSIS — Z3201 Encounter for pregnancy test, result positive: Secondary | ICD-10-CM | POA: Diagnosis not present

## 2024-03-02 DIAGNOSIS — O3680X0 Pregnancy with inconclusive fetal viability, not applicable or unspecified: Secondary | ICD-10-CM | POA: Diagnosis not present

## 2024-03-02 DIAGNOSIS — E039 Hypothyroidism, unspecified: Secondary | ICD-10-CM | POA: Diagnosis not present

## 2024-03-02 DIAGNOSIS — N912 Amenorrhea, unspecified: Secondary | ICD-10-CM | POA: Diagnosis not present

## 2024-03-02 DIAGNOSIS — R7303 Prediabetes: Secondary | ICD-10-CM | POA: Diagnosis not present

## 2024-03-03 ENCOUNTER — Other Ambulatory Visit: Payer: Self-pay

## 2024-03-03 DIAGNOSIS — N912 Amenorrhea, unspecified: Secondary | ICD-10-CM | POA: Diagnosis not present

## 2024-03-03 DIAGNOSIS — Z3201 Encounter for pregnancy test, result positive: Secondary | ICD-10-CM | POA: Diagnosis not present

## 2024-03-03 DIAGNOSIS — O0990 Supervision of high risk pregnancy, unspecified, unspecified trimester: Secondary | ICD-10-CM | POA: Insufficient documentation

## 2024-03-03 MED ORDER — LEVOTHYROXINE SODIUM 112 MCG PO TABS
112.0000 ug | ORAL_TABLET | Freq: Every day | ORAL | 11 refills | Status: DC
  Filled 2024-03-03: qty 30, 30d supply, fill #0
  Filled 2024-03-29: qty 30, 30d supply, fill #1
  Filled 2024-05-03: qty 30, 30d supply, fill #2

## 2024-03-04 ENCOUNTER — Other Ambulatory Visit: Payer: Self-pay

## 2024-03-04 DIAGNOSIS — R7303 Prediabetes: Secondary | ICD-10-CM | POA: Diagnosis not present

## 2024-03-04 DIAGNOSIS — Z1331 Encounter for screening for depression: Secondary | ICD-10-CM | POA: Diagnosis not present

## 2024-03-04 DIAGNOSIS — Z3A01 Less than 8 weeks gestation of pregnancy: Secondary | ICD-10-CM | POA: Diagnosis not present

## 2024-03-04 DIAGNOSIS — O0901 Supervision of pregnancy with history of infertility, first trimester: Secondary | ICD-10-CM | POA: Diagnosis not present

## 2024-03-04 DIAGNOSIS — Z0000 Encounter for general adult medical examination without abnormal findings: Secondary | ICD-10-CM | POA: Diagnosis not present

## 2024-03-04 DIAGNOSIS — D352 Benign neoplasm of pituitary gland: Secondary | ICD-10-CM | POA: Diagnosis not present

## 2024-03-04 MED ORDER — HEPATITIS B VAC RECOMB ADJ 20 MCG/0.5ML IM SOSY
0.5000 mL | PREFILLED_SYRINGE | INTRAMUSCULAR | 1 refills | Status: DC
  Filled 2024-03-08: qty 0.5, 30d supply, fill #0
  Filled 2024-04-20: qty 0.5, 30d supply, fill #1

## 2024-03-08 ENCOUNTER — Other Ambulatory Visit: Payer: Self-pay

## 2024-03-23 DIAGNOSIS — O0901 Supervision of pregnancy with history of infertility, first trimester: Secondary | ICD-10-CM | POA: Diagnosis not present

## 2024-03-25 ENCOUNTER — Other Ambulatory Visit: Payer: Self-pay

## 2024-03-25 DIAGNOSIS — E611 Iron deficiency: Secondary | ICD-10-CM | POA: Insufficient documentation

## 2024-03-25 MED ORDER — FERROUS SULFATE 325 (65 FE) MG PO TABS
ORAL_TABLET | ORAL | 0 refills | Status: DC
  Filled 2024-03-25: qty 36, 84d supply, fill #0

## 2024-03-29 ENCOUNTER — Other Ambulatory Visit: Payer: Self-pay

## 2024-04-06 ENCOUNTER — Ambulatory Visit

## 2024-04-09 ENCOUNTER — Telehealth

## 2024-04-09 DIAGNOSIS — O0990 Supervision of high risk pregnancy, unspecified, unspecified trimester: Secondary | ICD-10-CM

## 2024-04-09 DIAGNOSIS — A530 Latent syphilis, unspecified as early or late: Secondary | ICD-10-CM | POA: Insufficient documentation

## 2024-04-09 DIAGNOSIS — E221 Hyperprolactinemia: Secondary | ICD-10-CM

## 2024-04-09 DIAGNOSIS — R7689 Other specified abnormal immunological findings in serum: Secondary | ICD-10-CM | POA: Insufficient documentation

## 2024-04-09 DIAGNOSIS — Z905 Acquired absence of kidney: Secondary | ICD-10-CM

## 2024-04-09 DIAGNOSIS — Z3689 Encounter for other specified antenatal screening: Secondary | ICD-10-CM

## 2024-04-09 DIAGNOSIS — E039 Hypothyroidism, unspecified: Secondary | ICD-10-CM

## 2024-04-09 DIAGNOSIS — R7303 Prediabetes: Secondary | ICD-10-CM

## 2024-04-09 NOTE — Patient Instructions (Signed)

## 2024-04-09 NOTE — Progress Notes (Signed)
 New OB Intake  I connected with  Nicole Taylor on 04/09/24 at  2:15 PM EDT by telephone Video Visit and verified that I am speaking with the correct person using two identifiers. Nurse is located at [facility] and pt is located at home.  I discussed the limitations, risks, security and privacy concerns of performing an evaluation and management service by telephone and the availability of in person appointments. I also discussed with the patient that there may be a patient responsible charge related to this service. The patient expressed understanding and agreed to proceed.  I explained I am completing New OB Intake today. We discussed her EDD of 10/24/2024 that is based on LMP of 01/18/2024. Pt is G1/P0. I reviewed her allergies, medications, Medical/Surgical/OB history, and appropriate screenings. There are indoor cats in the home that are managed by her sister in law -  both she and husband are immunocompromised and know not to change the litter box. Based on history, this is a pregnancy complicated by prolactinoma, hyperprolactinemia, hypothyroidism, left nephrectomy, prediabetes and a positive RPR result on 03/02/24.  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  Reassured her this is normal for the end of the first trimester.  She reports some low abdominal pain and pressure but denies any bleeding.  Also advised this is normal.  Delivery Plans:  Plans to deliver at [facility].  Anatomy US  Patient had early bedside ultrasound during her 6th week.  Order placed today with instructions to call to schedule dating scan.   Anatomy US  will be scheduled around [redacted] weeks gestational age.  Labs Discussed genetic screening with patient. Patient consents to genetic testing to be drawn at new OB visit. Discussed possible labs to be drawn at new OB appointment.   Social Determinants of Health Food Insecurity: denies food insecurity WIC Referral: Patient is not interested in referral to [facility].  Transportation: patient does not drive.  Her husband will need to bring her to appointments.  Morning is best. Childcare: Discussed no children allowed at ultrasound appointments.   First visit review I reviewed new OB appt with pt. I explained she will have blood work and pap smear/pelvic exam if indicated. Explained pt will be seen by Jinnie Cookey CNM at first visit; encounter routed to appropriate provider.   Patient advised she is considered high risk and would benefit from seeing an MD for her initial appointment but there are no upcoming availabilities.  We will watch the schedule and if anything opens up she will be rescheduled to see the MD .  Of note, patient does not drive and will need to work appointments around her husband's schedule.  Rollo FORBES Louder, RN 04/09/2024  11:55 AM

## 2024-04-12 ENCOUNTER — Ambulatory Visit
Admission: RE | Admit: 2024-04-12 | Discharge: 2024-04-12 | Disposition: A | Discharge status: Home / Self Care | Source: Ambulatory Visit | Attending: Licensed Practical Nurse | Admitting: Licensed Practical Nurse

## 2024-04-12 DIAGNOSIS — Z3A12 12 weeks gestation of pregnancy: Secondary | ICD-10-CM | POA: Diagnosis not present

## 2024-04-12 DIAGNOSIS — O0990 Supervision of high risk pregnancy, unspecified, unspecified trimester: Secondary | ICD-10-CM | POA: Diagnosis not present

## 2024-04-12 DIAGNOSIS — Z3689 Encounter for other specified antenatal screening: Secondary | ICD-10-CM | POA: Diagnosis not present

## 2024-04-14 ENCOUNTER — Ambulatory Visit (INDEPENDENT_AMBULATORY_CARE_PROVIDER_SITE_OTHER): Admitting: Licensed Practical Nurse

## 2024-04-14 ENCOUNTER — Other Ambulatory Visit (HOSPITAL_COMMUNITY)
Admission: RE | Admit: 2024-04-14 | Discharge: 2024-04-14 | Disposition: A | Discharge status: Home / Self Care | Source: Ambulatory Visit | Attending: Licensed Practical Nurse | Admitting: Licensed Practical Nurse

## 2024-04-14 VITALS — BP 90/65 | HR 82 | Wt 139.2 lb

## 2024-04-14 DIAGNOSIS — Z131 Encounter for screening for diabetes mellitus: Secondary | ICD-10-CM

## 2024-04-14 DIAGNOSIS — Z3401 Encounter for supervision of normal first pregnancy, first trimester: Secondary | ICD-10-CM

## 2024-04-14 DIAGNOSIS — Z905 Acquired absence of kidney: Secondary | ICD-10-CM

## 2024-04-14 DIAGNOSIS — Z113 Encounter for screening for infections with a predominantly sexual mode of transmission: Secondary | ICD-10-CM | POA: Insufficient documentation

## 2024-04-14 DIAGNOSIS — E038 Other specified hypothyroidism: Secondary | ICD-10-CM

## 2024-04-14 DIAGNOSIS — O0990 Supervision of high risk pregnancy, unspecified, unspecified trimester: Secondary | ICD-10-CM

## 2024-04-14 DIAGNOSIS — Z3A12 12 weeks gestation of pregnancy: Secondary | ICD-10-CM | POA: Diagnosis not present

## 2024-04-14 DIAGNOSIS — Z369 Encounter for antenatal screening, unspecified: Secondary | ICD-10-CM

## 2024-04-14 DIAGNOSIS — Z1379 Encounter for other screening for genetic and chromosomal anomalies: Secondary | ICD-10-CM

## 2024-04-14 NOTE — Progress Notes (Unsigned)
 NEW OB HISTORY AND PHYSICAL  SUBJECTIVE:       Nicole Taylor is a 28 y.o. G1P0 female, Patient's last menstrual period was 01/18/2024 (exact date)., Estimated Date of Delivery: 10/24/24, [redacted]w[redacted]d, presents today for establishment of Prenatal Care. Here with her husband. She has been in [place] for about 4 years, she was getting her GYN care at [facility].  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  Exercise:walking Substance ldz:izwpzd tobacco/nicotine, alcohol, illicit drug use   Indications for ASA therapy (per uptodate) One of the following: Previous pregnancy with preeclampsia, especially early onset and with an adverse outcome No Multifetal gestation No Chronic hypertension No Type 1 or 2 diabetes mellitus No Chronic kidney disease No Autoimmune disease (antiphospholipid syndrome, systemic lupus erythematosus) No  Two or more of the following: Nulliparity Yes Obesity (body mass index >30 kg/m2) No Family history of preeclampsia in mother or sister No Age >=35 years No Sociodemographic characteristics (African American race, low socioeconomic level) No Personal risk factors (eg, previous pregnancy with low birth weight or small for gestational age infant, previous adverse pregnancy outcome [eg, stillbirth], interval >10 years between pregnancies) No  Indications for early GDM screening  First-degree relative with diabetes Yes father  BMI >30kg/m2 No Age > 35 No Previous birth of an infant weighing >=4000 g No Gestational diabetes mellitus in a previous pregnancy No Glycated hemoglobin >=5.7 percent (39 mmol/mol), impaired glucose tolerance, or impaired fasting glucose on previous testing Yes High-risk race/ethnicity  (eg, African American, Latino, Native American, Asian American, Pacific Islander) No Previous stillbirth of unknown cause No Maternal birthweight > 9 lbs Maybe, states I was a big baby History of cardiovascular disease No Hypertension or on therapy for hypertension No High-density lipoprotein cholesterol level <35 mg/dL (9.09 mmol/L) and/or a triglyceride level >250 mg/dL (7.17 mmol/L) No Polycystic ovary syndrome No Physical inactivity No Other clinical condition associated with insulin resistance (eg, severe obesity, acanthosis nigricans) No Current use of glucocorticoids No   Early screening tests: FBS, A1C, Random CBG, glucose challenge  Gynecologic History Patient's last menstrual period was 01/18/2024 (exact date). Normal Contraception: none Last Pap: NILM  05/21/2023 see care everywhere  Obstetric History OB History  Gravida Para Term Preterm AB Living  1       SAB IAB Ectopic Multiple Live Births          # Outcome Date GA Lbr Len/2nd Weight Sex Type Anes PTL Lv  1 Current             Past Medical History:  Diagnosis Date   Acquired solitary kidney    Hyperprolactinemia 05/21/2023   Hypothyroid    Pituitary adenoma (HCC)    Pre-diabetes    Prolactinoma (HCC)     Past Surgical History:  Procedure Laterality Date   NEPHRECTOMY Left     Current Outpatient Medications on File Prior to Visit  Medication Sig Dispense Refill   Ferrous Sulfate  (IRON PO) Take 1 tablet by mouth daily.     levothyroxine  (SYNTHROID ) 112 MCG tablet Take 1 tablet (112 mcg total) by mouth daily. Take on an empty stomach with a glass of water at least 30-60 minutes before breakfast. 30 tablet 11   Prenatal Vit-Fe Fumarate-FA (  MULTIVITAMIN-PRENATAL) 27-0.8 MG TABS tablet Take 1 tablet by mouth daily at 12 noon.     amoxicillin -clavulanate (AUGMENTIN ) 875-125 MG tablet Take 1 tablet by mouth every 12 (twelve) hours for 7 days . (Patient not taking: Reported on 04/09/2024) 14 tablet 0    cabergoline  (DOSTINEX ) 0.5 MG tablet Take 1/2 tablet (0.25 mg total) by mouth twice a week (Patient not taking: Reported on 04/09/2024) 13 tablet 2   cabergoline  (DOSTINEX ) 0.5 MG tablet Take 0.5 tablets (0.25 mg total) by mouth twice a week (Patient not taking: Reported on 04/09/2024) 13 tablet 2   cabergoline  (DOSTINEX ) 0.5 MG tablet Take 0.5 tablets (0.25 mg total) by mouth twice a week (Patient not taking: Reported on 04/09/2024) 13 tablet 2   cabergoline  (DOSTINEX ) 0.5 MG tablet Take 0.5 tablets (0.25 mg total) by mouth twice a week (Patient not taking: Reported on 04/09/2024) 13 tablet 2   cabergoline  (DOSTINEX ) 0.5 MG tablet Take 0.5 tablets (0.25 mg total) by mouth twice a week (Patient not taking: Reported on 04/09/2024) 13 tablet 2   cabergoline  (DOSTINEX ) 0.5 MG tablet Take 0.5 tablets (0.25 mg total) by mouth twice a week (Patient not taking: Reported on 04/09/2024) 13 tablet 2   cabergoline  (DOSTINEX ) 0.5 MG tablet Take 0.5 tablets (0.25 mg total) by mouth twice a week (Patient not taking: Reported on 04/09/2024) 13 tablet 2   cabergoline  (DOSTINEX ) 0.5 MG tablet Take 1 tablet (0.5 mg total) by mouth 2 (two) times a week. (Patient not taking: Reported on 04/09/2024) 13 tablet 3   cabergoline  (DOSTINEX ) 0.5 MG tablet Take 1/2 tablet by mouth 3 (three) times a week. (Patient not taking: Reported on 04/09/2024) 20 tablet 3   cabergoline  (DOSTINEX ) 0.5 MG tablet Take 0.5 tablets (0.25 mg total) by mouth 3 (three) times a week. (Patient not taking: Reported on 04/09/2024) 20 tablet 3   doxycycline  (VIBRA -TABS) 100 MG tablet Take 1 tablet (100 mg total) by mouth 2 (two) times daily for 5 days. Take starting the day of your HSG procedure. (Patient not taking: Reported on 04/09/2024) 10 tablet 0   ferrous sulfate  325 (65 FE) MG tablet Take 1 tablet (325 mg total) by mouth every Monday, Wednesday, and Friday for 90 days (Patient not taking: Reported on 04/09/2024) 36 tablet 0   ferrous sulfate  325  (65 FE) MG tablet Take 325 mg by mouth. (Patient not taking: Reported on 04/09/2024)     hepatitis b vaccine (HEPLISAV-B ) injection Inject 20 mcg (0.5 mLs total) into the muscle as directed. Repeat in 1 month. (Patient not taking: Reported on 04/09/2024) 0.5 mL 1   ibuprofen  (ADVIL ) 800 MG tablet Take 1 tablet (800 mg total) by mouth every 8 (eight) hours as needed for Pain for up to 7 days (Patient not taking: Reported on 04/09/2024) 20 tablet 0   No current facility-administered medications on file prior to visit.    No Known Allergies  Social History   Socioeconomic History   Marital status: Married    Spouse name: Deatrice Gambler   Number of children: Not on file   Years of education: Not on file   Highest education level: Not on file  Occupational History   Not on file  Tobacco Use   Smoking status: Never   Smokeless tobacco: Never  Vaping Use   Vaping status: Never Used  Substance and Sexual Activity   Alcohol use: Never   Drug use: Never   Sexual activity: Yes  Other Topics Concern   Not  on file  Social History Narrative   Not on file   Social Drivers of Health   Financial Resource Strain: Low Risk  (03/04/2024)   [facility] from [facility]   Overall Financial Resource Strain (CARDIA)    Difficulty of Paying Living Expenses: Not hard at all  Food Insecurity: No Food Insecurity (04/09/2024)   Hunger Vital Sign    Worried About Running Out of Food in the Last Year: Never true    Ran Out of Food in the Last Year: Never true  Transportation Needs: No Transportation Needs (04/09/2024)   PRAPARE - Administrator, Civil Service (Medical): No    Lack of Transportation (Non-Medical): No  Physical Activity: Not on file  Stress: Not on file  Social Connections: Not on file  Intimate Partner Violence: Not At Risk (04/09/2024)   Humiliation, Afraid, Rape, and Kick questionnaire    Fear of Current or Ex-Partner: No    Emotionally Abused: No     Physically Abused: No    Sexually Abused: No    Family History  Problem Relation Age of Onset   Diabetes Father    Hypothyroidism Father     The following portions of the patient's history were reviewed and updated as appropriate: allergies, current medications, past OB history, past medical history, past surgical history, past family history, past social history, and problem list.  Constitutional: Denied constitutional symptoms, night sweats, recent illness, fatigue, fever, insomnia and weight loss.  Eyes: Denied eye symptoms, eye pain, photophobia, vision change and visual disturbance.  Ears/Nose/Throat/Neck: Denied ear, nose, throat or neck symptoms, hearing loss, nasal discharge, sinus congestion and sore throat.  Cardiovascular: Denied cardiovascular symptoms, arrhythmia, chest pain/pressure, edema, exercise intolerance, orthopnea and palpitations.  Respiratory: Denied pulmonary symptoms, asthma, pleuritic pain, productive sputum, cough, dyspnea and wheezing.  Gastrointestinal: Denied gastro-esophageal reflux, melena, nausea and vomiting.  Genitourinary: Denied genitourinary symptoms including symptomatic vaginal discharge, pelvic relaxation issues, and urinary complaints.  Musculoskeletal: Denied musculoskeletal symptoms, stiffness, swelling, muscle weakness and myalgia.  Dermatologic: Denied dermatology symptoms, rash and scar.  Neurologic: Denied neurology symptoms, dizziness, headache, neck pain and syncope.  Psychiatric: Denied psychiatric symptoms, anxiety and depression.  Endocrine: Denied endocrine symptoms including hot flashes and night sweats.     OBJECTIVE: Initial Physical Exam (New OB)  Physical Exam Constitutional:      Appearance: Normal appearance.  Cardiovascular:     Rate and Rhythm: Normal rate and regular rhythm.     Pulses: Normal pulses.     Heart sounds: Normal heart sounds.  Pulmonary:     Effort: Pulmonary effort is normal.     Breath sounds:  Normal breath sounds.  Chest:     Comments: Breasts: soft, no redness or masses, nipple intact bilaterally.  Abdominal:     Tenderness: There is no abdominal tenderness.     Comments: Fetal heart tones  present   Genitourinary:    General: Normal vulva.     Comments: Declines speculum exam, swab blindly collected  Musculoskeletal:        General: Normal range of motion.     Cervical back: Normal range of motion and neck supple.     Right lower leg: No edema.     Left lower leg: No edema.  Skin:    General: Skin is warm.  Neurological:     General: No focal deficit present.     Mental Status: She is alert.  Psychiatric:  Mood and Affect: Mood normal.        Thought Content: Thought content normal.        ASSESSMENT: Normal pregnancy   PLAN: Routine prenatal care. We discussed an overview of prenatal care and when to call. Reviewed diet, exercise, and weight gain recommendations in pregnancy. Discussed benefits of breastfeeding and lactation resources at [facility]. I reviewed labs and answered all questions.  1. Encounter for supervision of normal first pregnancy in first trimester (Primary) - NOB Panel - Culture, OB Urine - Monitor Drug Profile 14(MW) - Nicotine screen, urine - Urinalysis, Routine w reflex microscopic - Hgb Fractionation Cascade - Toxoplasma antibodies- IgG and  IgM - Hemoglobin A1c - Cervicovaginal ancillary only - MaterniT21 PLUS Core - Glucose tolerance, 1 hour; Future - US  OB Comp + 14 Wk; Future - Comprehensive metabolic panel with GFR; Future - Protein / creatinine ratio, urine; Future - TSH + free T4; Future  2. [redacted] weeks gestation of pregnancy - NOB Panel - Culture, OB Urine - Monitor Drug Profile 14(MW) - Nicotine screen, urine - Urinalysis, Routine w reflex microscopic - Hgb Fractionation Cascade - Toxoplasma antibodies- IgG and  IgM - Hemoglobin A1c - Cervicovaginal ancillary only - MaterniT21 PLUS Core  3. Screening  examination for STD (sexually transmitted disease) - NOB Panel - Culture, OB Urine - Monitor Drug Profile 14(MW) - Nicotine screen, urine - Urinalysis, Routine w reflex microscopic - Hgb Fractionation Cascade - Toxoplasma antibodies- IgG and  IgM - Hemoglobin A1c - Cervicovaginal ancillary only  4. Genetic screening - MaterniT21 PLUS Core  5. Screening for diabetes mellitus - Glucose tolerance, 1 hour; Future  6. Encounter for fetal ultrasound - US  OB Comp + 14 Wk; Future  7. Single kidney - Comprehensive metabolic panel with GFR; Future - Protein / creatinine ratio, urine; Future  8. Other specified hypothyroidism - TSH + free T4; Future  Reviewed wt gain 25-35lbs   Tore Carreker M Muhamed Luecke, CNM

## 2024-04-15 ENCOUNTER — Encounter: Payer: Self-pay | Admitting: Licensed Practical Nurse

## 2024-04-15 ENCOUNTER — Other Ambulatory Visit: Payer: Self-pay | Admitting: Licensed Practical Nurse

## 2024-04-15 ENCOUNTER — Ambulatory Visit: Payer: Self-pay | Admitting: Licensed Practical Nurse

## 2024-04-15 DIAGNOSIS — B9689 Other specified bacterial agents as the cause of diseases classified elsewhere: Secondary | ICD-10-CM

## 2024-04-15 LAB — CERVICOVAGINAL ANCILLARY ONLY
Bacterial Vaginitis (gardnerella): POSITIVE — AB
Candida Glabrata: NEGATIVE
Candida Vaginitis: POSITIVE — AB
Chlamydia: NEGATIVE
Comment: NEGATIVE
Comment: NEGATIVE
Comment: NEGATIVE
Comment: NEGATIVE
Comment: NEGATIVE
Comment: NORMAL
Neisseria Gonorrhea: NEGATIVE
Trichomonas: NEGATIVE

## 2024-04-15 LAB — URINALYSIS, ROUTINE W REFLEX MICROSCOPIC
Bilirubin, UA: NEGATIVE
Glucose, UA: NEGATIVE
Ketones, UA: NEGATIVE
Leukocytes,UA: NEGATIVE
Nitrite, UA: NEGATIVE
Protein,UA: NEGATIVE
RBC, UA: NEGATIVE
Specific Gravity, UA: 1.014 (ref 1.005–1.030)
Urobilinogen, Ur: 0.2 mg/dL (ref 0.2–1.0)
pH, UA: 7 (ref 5.0–7.5)

## 2024-04-15 MED ORDER — METRONIDAZOLE 500 MG PO TABS
500.0000 mg | ORAL_TABLET | Freq: Two times a day (BID) | ORAL | 0 refills | Status: DC
  Filled 2024-04-15: qty 14, 7d supply, fill #0

## 2024-04-15 NOTE — Progress Notes (Signed)
 Pt seen for NOB, both BV and yeast noted on swab Script for Flagyl sent, pt sent mychart message regarding treatment Jinnie Cookey, CNM  [facility] 04/15/24  8:35 PM

## 2024-04-16 ENCOUNTER — Other Ambulatory Visit: Payer: Self-pay

## 2024-04-16 LAB — CBC/D/PLT+RPR+RH+ABO+RUBIGG...
Antibody Screen: NEGATIVE
Basophils Absolute: 0.1 x10E3/uL (ref 0.0–0.2)
Basos: 0 %
EOS (ABSOLUTE): 0.1 x10E3/uL (ref 0.0–0.4)
Eos: 1 %
HCV Ab: NONREACTIVE
HIV Screen 4th Generation wRfx: NONREACTIVE
Hematocrit: 40.5 % (ref 34.0–46.6)
Hemoglobin: 13.1 g/dL (ref 11.1–15.9)
Hepatitis B Surface Ag: NEGATIVE
Immature Grans (Abs): 0.1 x10E3/uL (ref 0.0–0.1)
Immature Granulocytes: 1 %
Lymphocytes Absolute: 2 x10E3/uL (ref 0.7–3.1)
Lymphs: 16 %
MCH: 26.5 pg — ABNORMAL LOW (ref 26.6–33.0)
MCHC: 32.3 g/dL (ref 31.5–35.7)
MCV: 82 fL (ref 79–97)
Monocytes Absolute: 0.7 x10E3/uL (ref 0.1–0.9)
Monocytes: 6 %
Neutrophils Absolute: 9.5 x10E3/uL — ABNORMAL HIGH (ref 1.4–7.0)
Neutrophils: 76 %
Platelets: 254 x10E3/uL (ref 150–450)
RBC: 4.94 x10E6/uL (ref 3.77–5.28)
RDW: 19.1 % — ABNORMAL HIGH (ref 11.7–15.4)
RPR Ser Ql: REACTIVE — AB
Rh Factor: POSITIVE
Rubella Antibodies, IGG: 32.7 {index} (ref >0.99)
Varicella zoster IgG: REACTIVE
WBC: 12.3 x10E3/uL — ABNORMAL HIGH (ref 3.4–10.8)

## 2024-04-16 LAB — MONITOR DRUG PROFILE 14(MW)
Amphetamine Scrn, Ur: NEGATIVE ng/mL
BARBITURATE SCREEN URINE: NEGATIVE ng/mL
BENZODIAZEPINE SCREEN, URINE: NEGATIVE ng/mL
Buprenorphine, Urine: NEGATIVE ng/mL
CANNABINOIDS UR QL SCN: NEGATIVE ng/mL
Cocaine (Metab) Scrn, Ur: NEGATIVE ng/mL
Creatinine(Crt), U: 65.3 mg/dL (ref 20.0–300.0)
Fentanyl, Urine: NEGATIVE pg/mL
Meperidine Screen, Urine: NEGATIVE ng/mL
Methadone Screen, Urine: NEGATIVE ng/mL
OXYCODONE+OXYMORPHONE UR QL SCN: NEGATIVE ng/mL
Opiate Scrn, Ur: NEGATIVE ng/mL
Ph of Urine: 6.6 (ref 4.5–8.9)
Phencyclidine Qn, Ur: NEGATIVE ng/mL
Propoxyphene Scrn, Ur: NEGATIVE ng/mL
SPECIFIC GRAVITY: 1.017
Tramadol Screen, Urine: NEGATIVE ng/mL

## 2024-04-16 LAB — TOXOPLASMA ANTIBODIES- IGG AND  IGM
Toxoplasma Antibody- IgM: 4 [AU]/ml (ref 0.0–7.9)
Toxoplasma IgG Ratio: <3 [IU]/mL (ref 0.0–7.1)

## 2024-04-16 LAB — NICOTINE SCREEN, URINE: Cotinine Ql Scrn, Ur: NEGATIVE ng/mL

## 2024-04-16 LAB — RPR, QUANT+TP ABS (REFLEX)
Rapid Plasma Reagin, Quant: 1:1 {titer} — ABNORMAL HIGH
T Pallidum Abs: NONREACTIVE

## 2024-04-16 LAB — HCV INTERPRETATION

## 2024-04-16 LAB — HEMOGLOBIN A1C
Est. average glucose Bld gHb Est-mCnc: 108 mg/dL
Hgb A1c MFr Bld: 5.4 % (ref 4.8–5.6)

## 2024-04-16 LAB — URINE CULTURE, OB REFLEX

## 2024-04-16 LAB — CULTURE, OB URINE

## 2024-04-18 LAB — HGB FRACTIONATION CASCADE
Hgb A2: 2.2 % (ref 1.8–3.2)
Hgb A: 97.8 % (ref 96.4–98.8)
Hgb F: 0 % (ref 0.0–2.0)
Hgb S: 0 %

## 2024-04-20 ENCOUNTER — Other Ambulatory Visit: Payer: Self-pay

## 2024-04-20 LAB — MATERNIT 21 PLUS CORE, BLOOD
Fetal Fraction: 15
Result (T21): NEGATIVE
Trisomy 13 (Patau syndrome): NEGATIVE
Trisomy 18 (Edwards syndrome): NEGATIVE
Trisomy 21 (Down syndrome): NEGATIVE

## 2024-04-20 MED ORDER — FLUZONE 0.5 ML IM SUSY
0.5000 mL | PREFILLED_SYRINGE | Freq: Once | INTRAMUSCULAR | 0 refills | Status: AC
  Filled 2024-04-20: qty 0.5, 1d supply, fill #0

## 2024-05-03 ENCOUNTER — Other Ambulatory Visit: Payer: Self-pay

## 2024-05-11 NOTE — Progress Notes (Unsigned)
    Return Prenatal Note   Subjective   28 y.o. G1P0 at [redacted]w[redacted]d presents for this follow-up prenatal visit.  Patient here with partner Patient reports:Did  not complete the Flagyl d/t being bitter, will try Metrogel  -Echo is concerned for her small weight gain, reports her appetite is better than it was, her partner states she does not finish a meal. She has stopped sugary foods/drinks out of concern for prediabetes.  Movement: Absent Contractions: Not present  Objective   Flow sheet Vitals: Pulse Rate: 79 BP: 105/72 Fetal Heart Rate (bpm): 144 Total weight gain: 5 lb 14.4 oz (2.676 kg)  General Appearance  No acute distress, well appearing, and well nourished Pulmonary   Normal work of breathing Neurologic   Alert and oriented to person, place, and time Psychiatric   Mood and affect within normal limits   Assessment/Plan   Plan  28 y.o. G1P0 at [redacted]w[redacted]d presents for follow-up OB visit. Reviewed prenatal record including previous visit note.  Supervision of high risk pregnancy, antepartum -TWG 5lbs, reassured she doing fine, rec small frequent meals and being sure to have protein -Discussed CBE, links placed in AVS -early 1 hour, TSH, CMP, and UPC collected -AFP declined  -Anatomy US  scheduled for 11/26 -Warning signs reviewed       No orders of the defined types were placed in this encounter.  Return in about 4 weeks (around 06/09/2024) for ROB.   Future Appointments  Date Time Provider Department Center  05/26/2024  9:15 AM AOB-A[facility] US  1 AOB-IMG None    For next visit:  continue with routine prenatal care     JINNIE HERO [facility], CNM  11/12/258:49 AM

## 2024-05-12 ENCOUNTER — Other Ambulatory Visit

## 2024-05-12 ENCOUNTER — Other Ambulatory Visit: Payer: Self-pay

## 2024-05-12 ENCOUNTER — Ambulatory Visit (INDEPENDENT_AMBULATORY_CARE_PROVIDER_SITE_OTHER): Admitting: Licensed Practical Nurse

## 2024-05-12 ENCOUNTER — Encounter: Payer: Self-pay | Admitting: Licensed Practical Nurse

## 2024-05-12 VITALS — BP 105/72 | HR 79 | Wt 140.9 lb

## 2024-05-12 DIAGNOSIS — E038 Other specified hypothyroidism: Secondary | ICD-10-CM

## 2024-05-12 DIAGNOSIS — Z3401 Encounter for supervision of normal first pregnancy, first trimester: Secondary | ICD-10-CM

## 2024-05-12 DIAGNOSIS — Z131 Encounter for screening for diabetes mellitus: Secondary | ICD-10-CM | POA: Diagnosis not present

## 2024-05-12 DIAGNOSIS — O0992 Supervision of high risk pregnancy, unspecified, second trimester: Secondary | ICD-10-CM

## 2024-05-12 DIAGNOSIS — B9689 Other specified bacterial agents as the cause of diseases classified elsewhere: Secondary | ICD-10-CM

## 2024-05-12 DIAGNOSIS — Z3A16 16 weeks gestation of pregnancy: Secondary | ICD-10-CM | POA: Diagnosis not present

## 2024-05-12 DIAGNOSIS — Z905 Acquired absence of kidney: Secondary | ICD-10-CM

## 2024-05-12 DIAGNOSIS — O0990 Supervision of high risk pregnancy, unspecified, unspecified trimester: Secondary | ICD-10-CM

## 2024-05-12 DIAGNOSIS — Z3402 Encounter for supervision of normal first pregnancy, second trimester: Secondary | ICD-10-CM

## 2024-05-12 MED ORDER — METRONIDAZOLE 0.75 % VA GEL
1.0000 | Freq: Every day | VAGINAL | 1 refills | Status: DC
  Filled 2024-05-12: qty 70, 5d supply, fill #0

## 2024-05-12 NOTE — Assessment & Plan Note (Addendum)
-  TWG 5lbs, reassured she doing fine, rec small frequent meals and being sure to have protein -Discussed CBE, links placed in AVS -early 1 hour, TSH, CMP, and UPC collected -AFP declined  -Anatomy US  scheduled for 11/26 -Warning signs reviewed

## 2024-05-12 NOTE — Patient Instructions (Addendum)
 [email or web address]  [email or web address]  [email or web address]

## 2024-05-13 LAB — COMPREHENSIVE METABOLIC PANEL WITH GFR
ALT: 9 IU/L (ref 0–32)
AST: 14 IU/L (ref 0–40)
Albumin: 3.6 g/dL — ABNORMAL LOW (ref 4.0–5.0)
Alkaline Phosphatase: 62 IU/L (ref 41–116)
BUN/Creatinine Ratio: 10 (ref 9–23)
BUN: 7 mg/dL (ref 6–20)
Bilirubin Total: 0.3 mg/dL (ref 0.0–1.2)
CO2: 17 mmol/L — ABNORMAL LOW (ref 20–29)
Calcium: 8.7 mg/dL (ref 8.7–10.2)
Chloride: 100 mmol/L (ref 96–106)
Creatinine, Ser: 0.72 mg/dL (ref 0.57–1.00)
Globulin, Total: 2.4 g/dL (ref 1.5–4.5)
Glucose: 194 mg/dL — ABNORMAL HIGH (ref 70–99)
Potassium: 3.8 mmol/L (ref 3.5–5.2)
Sodium: 133 mmol/L — ABNORMAL LOW (ref 134–144)
Total Protein: 6 g/dL (ref 6.0–8.5)
eGFR: 115 mL/min/1.73 (ref >59)

## 2024-05-13 LAB — PROTEIN / CREATININE RATIO, URINE
Creatinine, Urine: 162.6 mg/dL
Protein, Ur: 13 mg/dL
Protein/Creat Ratio: 80 mg/g{creat} (ref 0–200)

## 2024-05-13 LAB — TSH+FREE T4
Free T4: 1.37 ng/dL (ref 0.82–1.77)
TSH: 4.56 u[IU]/mL — ABNORMAL HIGH (ref 0.450–4.500)

## 2024-05-13 LAB — GLUCOSE TOLERANCE, 1 HOUR: Glucose, 1Hr PP: 199 mg/dL (ref 70–199)

## 2024-05-19 ENCOUNTER — Other Ambulatory Visit: Payer: Self-pay | Admitting: Licensed Practical Nurse

## 2024-05-19 ENCOUNTER — Other Ambulatory Visit: Payer: Self-pay

## 2024-05-19 DIAGNOSIS — R7303 Prediabetes: Secondary | ICD-10-CM | POA: Diagnosis not present

## 2024-05-19 DIAGNOSIS — O24410 Gestational diabetes mellitus in pregnancy, diet controlled: Secondary | ICD-10-CM

## 2024-05-19 MED ORDER — GLUCOSE BLOOD VI STRP
ORAL_STRIP | 12 refills | Status: AC
  Filled 2024-05-19: qty 100, 25d supply, fill #0
  Filled 2024-06-09: qty 100, 25d supply, fill #1

## 2024-05-19 MED ORDER — FREESTYLE LANCETS MISC
1.0000 | Freq: Four times a day (QID) | 12 refills | Status: AC
  Filled 2024-05-19: qty 100, 25d supply, fill #0
  Filled 2024-06-09: qty 100, 25d supply, fill #1

## 2024-05-19 MED ORDER — FREESTYLE LITE W/DEVICE KIT
1.0000 | PACK | Freq: Four times a day (QID) | 0 refills | Status: DC
  Filled 2024-05-19: qty 1, 30d supply, fill #0

## 2024-05-19 MED ORDER — LEVOTHYROXINE SODIUM 125 MCG PO TABS
125.0000 ug | ORAL_TABLET | Freq: Every day | ORAL | 11 refills | Status: AC
  Filled 2024-05-19: qty 30, 30d supply, fill #0
  Filled 2024-06-21 (×2): qty 30, 30d supply, fill #1
  Filled 2024-07-19 – 2024-07-20 (×2): qty 30, 30d supply, fill #2

## 2024-05-19 NOTE — Progress Notes (Signed)
 Pt with early 1 hour reading of 199, pt was fasting for test. Reviewed with Dr Leigh, will treat as GDM, blood glucose supplies ordered. Mychart message sent to  pt Jinnie Cookey, CNM  [facility] 05/19/24  12:53 AM

## 2024-05-26 ENCOUNTER — Ambulatory Visit

## 2024-05-26 DIAGNOSIS — Z3401 Encounter for supervision of normal first pregnancy, first trimester: Secondary | ICD-10-CM

## 2024-05-26 DIAGNOSIS — Z3402 Encounter for supervision of normal first pregnancy, second trimester: Secondary | ICD-10-CM

## 2024-05-26 DIAGNOSIS — Z3A18 18 weeks gestation of pregnancy: Secondary | ICD-10-CM | POA: Diagnosis not present

## 2024-05-26 DIAGNOSIS — Z369 Encounter for antenatal screening, unspecified: Secondary | ICD-10-CM

## 2024-06-06 ENCOUNTER — Other Ambulatory Visit: Payer: Self-pay

## 2024-06-07 ENCOUNTER — Other Ambulatory Visit: Payer: Self-pay

## 2024-06-08 ENCOUNTER — Encounter: Payer: Self-pay | Admitting: Obstetrics

## 2024-06-08 ENCOUNTER — Other Ambulatory Visit: Payer: Self-pay

## 2024-06-08 NOTE — Progress Notes (Unsigned)
    Return Prenatal Note   Subjective   28 y.o. G1P0 at [redacted]w[redacted]d presents for this follow-up prenatal visit.  Patient *** Patient reports:    Objective   Flow sheet Vitals:   Total weight gain: 5 lb 14.4 oz (2.676 kg)  General Appearance  No acute distress, well appearing, and well nourished Pulmonary   Normal work of breathing Neurologic   Alert and oriented to person, place, and time Psychiatric   Mood and affect within normal limits   Assessment/Plan   Plan  28 y.o. G1P0 at [redacted]w[redacted]d presents for follow-up OB visit. Reviewed prenatal record including previous visit note.  No problem-specific Assessment & Plan notes found for this encounter.      No orders of the defined types were placed in this encounter.  No follow-ups on file.   Future Appointments  Date Time Provider Department Center  06/09/2024 10:35 AM Joan Herschberger, Damien, CNM AOB-AOB None    For next visit:  continue with routine prenatal care     Damien Parsley, CNM [facility] 12/09/253:36 PM

## 2024-06-09 ENCOUNTER — Other Ambulatory Visit: Payer: Self-pay | Admitting: Licensed Practical Nurse

## 2024-06-09 ENCOUNTER — Other Ambulatory Visit: Payer: Self-pay

## 2024-06-09 ENCOUNTER — Ambulatory Visit: Admitting: Certified Nurse Midwife

## 2024-06-09 VITALS — BP 107/77 | HR 90 | Wt 146.9 lb

## 2024-06-09 DIAGNOSIS — Z3401 Encounter for supervision of normal first pregnancy, first trimester: Secondary | ICD-10-CM

## 2024-06-09 DIAGNOSIS — O99282 Endocrine, nutritional and metabolic diseases complicating pregnancy, second trimester: Secondary | ICD-10-CM

## 2024-06-09 DIAGNOSIS — Z3A20 20 weeks gestation of pregnancy: Secondary | ICD-10-CM | POA: Diagnosis not present

## 2024-06-09 DIAGNOSIS — O24419 Gestational diabetes mellitus in pregnancy, unspecified control: Secondary | ICD-10-CM | POA: Insufficient documentation

## 2024-06-09 DIAGNOSIS — O24410 Gestational diabetes mellitus in pregnancy, diet controlled: Secondary | ICD-10-CM

## 2024-06-09 DIAGNOSIS — E039 Hypothyroidism, unspecified: Secondary | ICD-10-CM

## 2024-06-09 DIAGNOSIS — O0990 Supervision of high risk pregnancy, unspecified, unspecified trimester: Secondary | ICD-10-CM

## 2024-06-09 MED ORDER — FREESTYLE LITE W/DEVICE KIT
1.0000 | PACK | Freq: Four times a day (QID) | 0 refills | Status: AC
  Filled 2024-06-09 – 2024-06-21 (×3): qty 1, 30d supply, fill #0

## 2024-06-09 NOTE — Assessment & Plan Note (Signed)
 Anatomy US  completed. Reviewed red flag warning signs anticipatory guidance for upcoming prenatal care.

## 2024-06-09 NOTE — Assessment & Plan Note (Signed)
TSH drawn today

## 2024-06-09 NOTE — Assessment & Plan Note (Addendum)
-  Reviewed log on phone (will send us  a screen shot through [employer]). All fastings, breakfast and dinner values are normal. She was elevated 11 out of 16 lunches. She has not been eating more carbohydrates and fruit at this meal. Discusses alternative foods to support her blood sugar levels. Discussed walking directly after lunch to improve numbers.  -ROB in two weeks to check log -Growth US  for 28 weeks ordered and scheduled today.

## 2024-06-10 LAB — CBC
Hematocrit: 40 % (ref 34.0–46.6)
Hemoglobin: 13.3 g/dL (ref 11.1–15.9)
MCH: 29.1 pg (ref 26.6–33.0)
MCHC: 33.3 g/dL (ref 31.5–35.7)
MCV: 88 fL (ref 79–97)
Platelets: 260 x10E3/uL (ref 150–450)
RBC: 4.57 x10E6/uL (ref 3.77–5.28)
RDW: 15.6 % — ABNORMAL HIGH (ref 11.7–15.4)
WBC: 11.3 x10E3/uL — ABNORMAL HIGH (ref 3.4–10.8)

## 2024-06-10 LAB — TSH: TSH: 0.991 u[IU]/mL (ref 0.450–4.500)

## 2024-06-13 ENCOUNTER — Other Ambulatory Visit: Payer: Self-pay

## 2024-06-21 ENCOUNTER — Other Ambulatory Visit: Payer: Self-pay

## 2024-06-22 NOTE — Progress Notes (Deleted)
" ° ° °  Return Prenatal Note   Assessment/Plan   Plan  28 y.o. G1P0 at [redacted]w[redacted]d presents for follow-up OB visit. Reviewed prenatal record including previous visit note.  No problem-specific Assessment & Plan notes found for this encounter.    No orders of the defined types were placed in this encounter.  No follow-ups on file.   Future Appointments  Date Time Provider Department Center  06/25/2024  3:35 PM Justino Eleanor HERO, CNM AOB-AOB None  08/04/2024 10:15 AM AOB-AOB US  1 AOB-IMG None    For next visit:  {Return Prenatal Care:31737}    Subjective       Objective   Date Fasting Breakfast Lunch Dinner                                                   Highest /lowest fasting: 2 hour post prandial Highest/lowest Breakfast: Highest lowest Lunch: Highest/lowest Dinner:  Flow sheet Vitals:   Total weight gain: 11 lb 14.4 oz (5.398 kg)  General Appearance  No acute distress, well appearing, and well nourished Pulmonary   Normal work of breathing Neurologic   Alert and oriented to person, place, and time Psychiatric   Mood and affect within normal limits  Eleanor Justino, CNM 06/22/2024 11:34 AM  "

## 2024-06-25 ENCOUNTER — Encounter: Admitting: Obstetrics

## 2024-06-25 DIAGNOSIS — Z3A22 22 weeks gestation of pregnancy: Secondary | ICD-10-CM

## 2024-07-05 NOTE — Progress Notes (Signed)
" ° ° °  Return Prenatal Note   Subjective   29 y.o. G1P0 at 24w2 presents for this follow-up prenatal visit.  Patient Here with her partner  Patient reports: notices her blood sugars are elevated when she has many carbs -tends to eat bread and rice with small amount of protein and vegetables for lunch -is not exercising  -has increased hunger is concerned because she is hungry and wants to eat, but does not want to make her blood sugars worse  Movement: Present Contractions: Not present  Objective   Flow sheet Vitals: Pulse Rate: 93 BP: 107/70 Fundal Height:  (26.5) Fetal Heart Rate (bpm): 155 Total weight gain: 16 lb (7.258 kg)    Date Fasting Breakfast Lunch Dinner  12/11 93 90 155 83  12/12  90  92     12/13 98 89 110 115  12/15 91 81  118 114  12/16 89     12/17 95 89    12/18 92 103  126  135  Highest /lowest fasting:95/89 2 hour post prandial Highest/lowest Breakfast:103/81 Highest lowest Lunch:155/110 Highest/lowest Dinner: 135/83 Date Fasting Breakfast Lunch Dinner  12/20 90 78 134 115  12/21  91  86 151  125  12/22 97 78  127 120  12/23 99 88     12/24 103 86 102 114  1/1 93 79 128   1/3 94 87      Highest /lowest fasting: 103/90 2 hour post prandial Highest/lowest Breakfast:88/78 Highest lowest Lunch:151/102 Highest/lowest Dinner: 125/114    General Appearance  No acute distress, well appearing, and well nourished Pulmonary   Normal work of breathing Neurologic   Alert and oriented to person, place, and time Psychiatric   Mood and affect within normal limits   Assessment/Plan   Plan  29 y.o. G1P0 at  [redacted]w[redacted]d presents for follow-up OB visit. Reviewed prenatal record including previous visit note.  GDM (gestational diabetes mellitus) -Pt would like to take 3hour GTT, discussed she probably does not need this given that her 1 hour was 199 and we are treating her as GDM, but will do at next visit  -Glucose log reviewed, Lunch values out of  range -Encouraged to decrease carbs and increase protein with lunch and dinner meals, add physical activity especially after lunch meal   Supervision of high risk pregnancy, antepartum -discussed CBE       No orders of the defined types were placed in this encounter.  No follow-ups on file.   Future Appointments  Date Time Provider Department Center  07/21/2024  8:20 AM AOB-OBGYN LAB AOB-AOB None  07/21/2024  8:55 AM Sebastian Sham, CNM AOB-AOB None  08/04/2024 10:15 AM AOB-AOB US  1 AOB-IMG None    For next visit:  continue with routine prenatal care     JINNIE HERO [facility], CNM  1/10/20263:59 PM  "

## 2024-07-06 ENCOUNTER — Encounter: Payer: Self-pay | Admitting: Licensed Practical Nurse

## 2024-07-06 ENCOUNTER — Ambulatory Visit: Admitting: Licensed Practical Nurse

## 2024-07-06 VITALS — BP 107/70 | HR 93 | Wt 151.0 lb

## 2024-07-06 DIAGNOSIS — O24419 Gestational diabetes mellitus in pregnancy, unspecified control: Secondary | ICD-10-CM

## 2024-07-06 DIAGNOSIS — Z3A24 24 weeks gestation of pregnancy: Secondary | ICD-10-CM | POA: Diagnosis not present

## 2024-07-06 DIAGNOSIS — Z3402 Encounter for supervision of normal first pregnancy, second trimester: Secondary | ICD-10-CM

## 2024-07-06 DIAGNOSIS — O0990 Supervision of high risk pregnancy, unspecified, unspecified trimester: Secondary | ICD-10-CM

## 2024-07-06 NOTE — Assessment & Plan Note (Addendum)
-  discussed CBE, list of classes sent to pt  -TWG 16lbs, WNL  -warning  signs reviewed

## 2024-07-06 NOTE — Assessment & Plan Note (Addendum)
-  Pt would like to take 3hour GTT, discussed she probably does not need this given that her 1 hour was 199 and we are treating her as GDM, but will do at next visit  -Glucose log reviewed, Lunch values out of range, she may need medication, will review with Dr Leigh -Encouraged to decrease carbs and increase protein with lunch and dinner meals, add physical activity especially after lunch meal

## 2024-07-07 ENCOUNTER — Other Ambulatory Visit: Payer: Self-pay | Admitting: Licensed Practical Nurse

## 2024-07-07 ENCOUNTER — Other Ambulatory Visit: Payer: Self-pay

## 2024-07-07 DIAGNOSIS — O24419 Gestational diabetes mellitus in pregnancy, unspecified control: Secondary | ICD-10-CM

## 2024-07-07 MED ORDER — BOOSTRIX 5-2.5-18.5 LF-MCG/0.5 IM SUSY
PREFILLED_SYRINGE | INTRAMUSCULAR | 0 refills | Status: AC
  Filled 2024-07-07: qty 0.5, 1d supply, fill #0

## 2024-07-07 MED ORDER — METFORMIN HCL 500 MG PO TABS
500.0000 mg | ORAL_TABLET | Freq: Every day | ORAL | 5 refills | Status: DC
  Filled 2024-07-07: qty 30, 30d supply, fill #0

## 2024-07-07 NOTE — Progress Notes (Signed)
 Pt seen for ROB, A1GDM, lunchtime readings elevated, reviewed with Dr Leigh, will start Metformin  500mg  at [facility], CNM  [facility] 07/07/2024  9:28 AM

## 2024-07-19 ENCOUNTER — Other Ambulatory Visit: Payer: Self-pay

## 2024-07-19 ENCOUNTER — Other Ambulatory Visit: Payer: Self-pay | Admitting: Licensed Practical Nurse

## 2024-07-19 DIAGNOSIS — O99810 Abnormal glucose complicating pregnancy: Secondary | ICD-10-CM

## 2024-07-20 ENCOUNTER — Other Ambulatory Visit

## 2024-07-20 ENCOUNTER — Encounter: Admitting: Certified Nurse Midwife

## 2024-07-20 ENCOUNTER — Other Ambulatory Visit: Payer: Self-pay

## 2024-07-20 NOTE — Patient Instructions (Signed)
 Third Trimester of Pregnancy  The third trimester of pregnancy is from week 28 through week 40. This is months 7 through 9. The third trimester is a time when your baby is growing fast. Body changes during your third trimester Your body continues to change during this time. The changes usually go away after your baby is born. Physical changes You will continue to gain weight. You may get stretch marks on your hips, belly, and breasts. Your breasts will keep growing and may hurt. A yellow fluid (colostrum) may leak from your breasts. This is the first milk you're making for your baby. Your hair may grow faster and get thicker. In some cases, you may get hair loss. Your belly button may stick out. You may have more swelling in your hands, face, or ankles. Health changes You may have heartburn. You may feel short of breath. This is caused by the uterus that is now bigger. You may have more aches in the pelvis, back, or thighs. You may have more tingling or numbness in your hands, arms, and legs. You may pee more often. You may have trouble pooping (constipation) or swollen veins in the butt that can itch or get painful (hemorrhoids). Other changes You may have more problems sleeping. You may notice the baby moving lower in your belly (dropping). You may have more fluid coming from your vagina. Your joints may feel loose, and you may have pain around your pelvic bone. Follow these instructions at home: Medicines Take medicines only as told by your health care provider. Some medicines are not safe during pregnancy. Your provider may change the medicines that you take. Do not take any medicines unless told to by your provider. Take a prenatal vitamin that has at least 600 micrograms (mcg) of folic acid. Do not use herbal medicines, illegal drugs, or medicines that are not approved by your provider. Eating and drinking While you're pregnant your body needs additional nutrition to help  support your growing baby. Talk with your provider about your nutritional needs. Activity Most women are able to exercise regularly during pregnancy. Exercise routines may need to change at the end of your pregnancy. Talk to your provider about your activities and exercise routine. Relieving pain and discomfort Rest often with your legs raised if you have leg cramps or low back pain. Take warm sitz baths to soothe pain from hemorrhoids. Use hemorrhoid cream if your provider says it's okay. Wear a good, supportive bra if your breasts hurt. Do not use hot tubs, steam rooms, or saunas. Do not douche. Do not use tampons or scented pads. Safety Talk to your provider before traveling far distances. Wear your seatbelt at all times when you're in a car. Talk to your provider if someone hits you, hurts you, or yells at you. Preparing for birth To prepare for your baby: Take childbirth and breastfeeding classes. Visit the hospital and tour the maternity area. Buy a rear-facing car seat. Learn how to install it in your car. General instructions Avoid cat litter boxes and soil used by cats. These things carry germs that can cause harm to your pregnancy and your baby. Do not drink alcohol, smoke, vape, or use products with nicotine  or tobacco in them. If you need help quitting, talk with your provider. Keep all follow-up visits for your third trimester. Your provider will do more exams and tests during this trimester. Write down your questions. Take them to your prenatal visits. Your provider also will: Talk with you about  your overall health. Give you advice or refer you to specialists who can help with different needs, including: Mental health and counseling. Foods and healthy eating. Ask for help if you need help with food. Where to find more information American Pregnancy Association: americanpregnancy.org [employer]: acog.org Office on [employer] Health:  [place] Contact a health care provider if: You have a headache that does not go away when you take medicine. You have any of these problems: You can't eat or drink. You have nausea and vomiting. You have watery poop (diarrhea) for 2 days or more. You have pain when you pee, or your pee smells bad. You have been sick for 2 days or more and aren't getting better. Contact your provider right away if: You have any of these coming from your vagina: Abnormal discharge. Bad-smelling fluid. Bleeding. Your baby is moving less than usual. You have signs of labor: You have any contractions, belly cramping, or have pain in your pelvis or lower back before 37 weeks of pregnancy (preterm labor). You have regular contractions that are less than 5 minutes apart. Your water breaks. You have symptoms of high blood pressure or preeclampsia. These include: A severe, throbbing headache that does not go away. Sudden or extreme swelling of your face, hands, legs, or feet. Vision problems: You see spots. You have blurry vision. Your eyes are sensitive to light. If you can't reach your provider, go to an urgent care or emergency room. Get help right away if: You faint, become confused, or can't think clearly. You have chest pain or trouble breathing. You have any kind of injury, such as from a fall or a car crash. These symptoms may be an emergency. Call 911 right away. Do not wait to see if the symptoms will go away. Do not drive yourself to the hospital. This information is not intended to replace advice given to you by your health care provider. Make sure you discuss any questions you have with your health care provider. Document Revised: 03/20/2023 Document Reviewed: 10/18/2022 Elsevier Patient Education  2024 [employer]Tests and Screening During Pregnancy Tests and screenings during pregnancy are an important part of your prenatal care. These tests help your health care provider find  any problems that might affect your pregnancy. Some tests need to be done for all pregnant people, and some are optional. Most of the tests and screenings do not pose any risks for you or your baby. You may need more testing if a test result shows there is a risk to your health or your baby's health. Tests and screenings done early in pregnancy Some tests and screenings you may have in early pregnancy are: Blood tests, such as: Complete blood count (CBC). Blood typing. Tests to check for diseases that can cause birth defects or can be passed to your baby, such as: German measles (rubella( and chicken pox. Hepatitis B and C. Human Immunodeficiency Virus (HIV). Syphilis. Zika virus. Pee tests. Blood pressure. Testing for sexually transmitted infections (STIs), such as chlamydia or gonorrhea. Testing for tuberculosis. Ultrasound. Tests and screenings done later in pregnancy Some common tests you can expect to have later in pregnancy include: Rh antibody testing. Pee and blood tests. Glucose screening. This checks your blood sugar. It will show whether you are developing the type of diabetes that happens during pregnancy, called gestational diabetes. You may have this screening earlier if you have risk factors for diabetes. Ultrasound. This may be repeated at 16-20 weeks to check  how your baby is growing. Screening for group B streptococcus (GBS). GBS is a type of bacteria that may live in your rectum or vagina. GBS can spread to your baby during birth. This test is done at 35-37 weeks of pregnancy. Non-stress test. This may be done more often if your pregnancy is high risk. Biophysical profile. This test includes ultrasound imaging and a non-stress test to check to see if your baby is healthy. This test may help decide when your baby should be born. Screening for birth defects Early in your pregnancy, tests can be done to find out if your baby is at risk for a genetic disorder. This testing  is optional. The type of testing recommended for you will depend on your family and medical history, your ethnicity, and your age. Testing may include: Screening tests such as ultrasound, blood tests, or a combination of both. Carrier screening. If genetic screening shows that your baby is at risk for a genetic defect, diagnostic testing may be recommended, such as: Amniocentesis. Chorionic villus sampling. Unlike other tests done during pregnancy, diagnostic testing does have some risk for your pregnancy. Talk to your provider about the risks and benefits of genetic testing. Questions to ask your health care provider What tests are recommended for me? When and how will these tests be done? When will I get the results of the tests? What do the results of these tests mean for me or my baby? Do you recommend any genetic screening tests? Which ones? Should I see a genetic counselor before having genetic screening? Where to find more information Go to americanpregnancy.org Click on search. Type 'prenatal tests in the search box. Go to [place] Click on search. Type 'prenatal tests in the search box. Go to acog.org Click on search. Type routine tests in the search box. This information is not intended to replace advice given to you by your health care provider. Make sure you discuss any questions you have with your health care provider. Document Revised: 04/15/2023 Document Reviewed: 04/15/2023 Elsevier Patient Education  2025 [employer].

## 2024-07-21 ENCOUNTER — Other Ambulatory Visit

## 2024-07-21 ENCOUNTER — Ambulatory Visit: Admitting: Certified Nurse Midwife

## 2024-07-21 ENCOUNTER — Other Ambulatory Visit: Payer: Self-pay

## 2024-07-21 VITALS — BP 90/64 | HR 88 | Wt 157.0 lb

## 2024-07-21 DIAGNOSIS — Z3482 Encounter for supervision of other normal pregnancy, second trimester: Secondary | ICD-10-CM | POA: Diagnosis not present

## 2024-07-21 DIAGNOSIS — Z3483 Encounter for supervision of other normal pregnancy, third trimester: Secondary | ICD-10-CM

## 2024-07-21 DIAGNOSIS — O99810 Abnormal glucose complicating pregnancy: Secondary | ICD-10-CM

## 2024-07-21 DIAGNOSIS — Z3A26 26 weeks gestation of pregnancy: Secondary | ICD-10-CM | POA: Diagnosis not present

## 2024-07-21 DIAGNOSIS — Z23 Encounter for immunization: Secondary | ICD-10-CM

## 2024-07-21 MED ORDER — METFORMIN HCL 500 MG PO TABS
500.0000 mg | ORAL_TABLET | Freq: Two times a day (BID) | ORAL | 5 refills | Status: AC
  Filled 2024-07-21: qty 60, 30d supply, fill #0

## 2024-07-21 NOTE — Progress Notes (Addendum)
 Date Fasting Breakfast Lunch Dinner  07/07/24 97 103 138 119  07/08/24 95 83 118 135  07/09/24 97 93 115 125  07/10/24 98 100 125 115  07/11/24 99 79 121 117  07/12/24 93 85 127 113  07/13/24 94 97 119 117  Highest /lowest fasting: 2 hour post prandial Highest/lowest Breakfast: Highest lowest Lunch: Highest/lowest Dinner:   ROB doing well, reports feeling good movement. We discussed diabetes management and medication change per consult with Dr. Leigh to metformin  500 mg BID. Encouraged patient to continue monitoring blood sugars for the next visit. Stressed the importance of medication adherence due to complications with baby and delivery. Talked about the growth ultrasound which is scheduled for 2/04. Discussed kick counts for third trimester.      As the supervising advanced practice provider, I certify that I have collaborated with the care management team and personally reviewed and directed the care plan for this patient's conditions.      Zelda Hummer, CNM

## 2024-08-04 ENCOUNTER — Ambulatory Visit: Admitting: Obstetrics

## 2024-08-04 ENCOUNTER — Other Ambulatory Visit: Payer: Self-pay

## 2024-08-04 ENCOUNTER — Ambulatory Visit

## 2024-08-04 VITALS — BP 102/68 | HR 101 | Wt 158.0 lb

## 2024-08-04 DIAGNOSIS — Z113 Encounter for screening for infections with a predominantly sexual mode of transmission: Secondary | ICD-10-CM

## 2024-08-04 DIAGNOSIS — E039 Hypothyroidism, unspecified: Secondary | ICD-10-CM

## 2024-08-04 DIAGNOSIS — O24415 Gestational diabetes mellitus in pregnancy, controlled by oral hypoglycemic drugs: Secondary | ICD-10-CM

## 2024-08-04 DIAGNOSIS — O0990 Supervision of high risk pregnancy, unspecified, unspecified trimester: Secondary | ICD-10-CM

## 2024-08-04 DIAGNOSIS — Z130 Encounter for screening for diseases of the blood and blood-forming organs and certain disorders involving the immune mechanism: Secondary | ICD-10-CM

## 2024-08-04 MED ORDER — FAMOTIDINE 20 MG PO TABS
20.0000 mg | ORAL_TABLET | Freq: Two times a day (BID) | ORAL | 3 refills | Status: AC
  Filled 2024-08-04: qty 60, 30d supply, fill #0
  Filled 2024-08-04: qty 11, 6d supply, fill #0

## 2024-08-04 NOTE — Assessment & Plan Note (Signed)
"  TSH collected today  "

## 2024-08-04 NOTE — Assessment & Plan Note (Signed)
-  Continue metformin  BID -Growth scan scheduled for 08/09/24 and then q 4 weeks -NSTs at 32 weeks

## 2024-08-04 NOTE — Assessment & Plan Note (Signed)
-  CBC, HIV, RPR today -Discussed comfort measures for GERD. Will try Tums . Rx sent for famotidine  if no relief from Tums. -Reviewed kick counts and preterm labor warning signs. Instructed to call office or come to hospital with persistent headache, vision changes, regular contractions, leaking of fluid, decreased fetal movement or vaginal bleeding.

## 2024-08-06 ENCOUNTER — Ambulatory Visit: Payer: Self-pay | Admitting: Obstetrics

## 2024-08-06 LAB — RPR, QUANT+TP ABS (REFLEX)
Rapid Plasma Reagin, Quant: 1:1 {titer} — ABNORMAL HIGH
T Pallidum Abs: NONREACTIVE

## 2024-08-06 LAB — SYPHILIS: RPR W/REFLEX TO RPR TITER AND TREPONEMAL ANTIBODIES, TRADITIONAL SCREENING AND DIAGNOSIS ALGORITHM: RPR Ser Ql: REACTIVE — AB

## 2024-08-06 LAB — CBC
Hematocrit: 39.7 % (ref 34.0–46.6)
Hemoglobin: 12.9 g/dL (ref 11.1–15.9)
MCH: 29.4 pg (ref 26.6–33.0)
MCHC: 32.5 g/dL (ref 31.5–35.7)
MCV: 90 fL (ref 79–97)
Platelets: 249 10*3/uL (ref 150–450)
RBC: 4.39 x10E6/uL (ref 3.77–5.28)
RDW: 12.8 % (ref 11.7–15.4)
WBC: 14.1 10*3/uL — ABNORMAL HIGH (ref 3.4–10.8)

## 2024-08-06 LAB — HIV ANTIBODY (ROUTINE TESTING W REFLEX): HIV Screen 4th Generation wRfx: NONREACTIVE

## 2024-08-06 LAB — TSH PREGNANCY: TSH Pregnancy: 1.82 u[IU]/mL (ref 0.450–4.500)

## 2024-08-09 ENCOUNTER — Other Ambulatory Visit

## 2024-08-18 ENCOUNTER — Encounter: Admitting: Registered Nurse

## 2024-09-01 ENCOUNTER — Other Ambulatory Visit

## 2024-09-15 ENCOUNTER — Encounter: Admitting: Obstetrics

## 2024-09-29 ENCOUNTER — Other Ambulatory Visit
